# Patient Record
Sex: Female | Born: 1980 | Race: Black or African American | Hispanic: No | Marital: Single | Smoking: Never smoker
Health system: Southern US, Community
[De-identification: ages and names within clinical notes are randomized; demographics above are authoritative.]

## PROBLEM LIST (undated history)

## (undated) DIAGNOSIS — F32A Depression, unspecified: Secondary | ICD-10-CM

## (undated) DIAGNOSIS — F419 Anxiety disorder, unspecified: Secondary | ICD-10-CM

## (undated) DIAGNOSIS — D179 Benign lipomatous neoplasm, unspecified: Secondary | ICD-10-CM

## (undated) DIAGNOSIS — G43909 Migraine, unspecified, not intractable, without status migrainosus: Secondary | ICD-10-CM

## (undated) DIAGNOSIS — L989 Disorder of the skin and subcutaneous tissue, unspecified: Secondary | ICD-10-CM

## (undated) DIAGNOSIS — I10 Essential (primary) hypertension: Secondary | ICD-10-CM

## (undated) DIAGNOSIS — F329 Major depressive disorder, single episode, unspecified: Secondary | ICD-10-CM

## (undated) DIAGNOSIS — D649 Anemia, unspecified: Secondary | ICD-10-CM

## (undated) HISTORY — DX: Anxiety disorder, unspecified: F41.9

## (undated) HISTORY — DX: Migraine, unspecified, not intractable, without status migrainosus: G43.909

## (undated) HISTORY — PX: NO PAST SURGERIES: SHX2092

## (undated) HISTORY — DX: Depression, unspecified: F32.A

---
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
## 1999-03-14 ENCOUNTER — Emergency Department (HOSPITAL_COMMUNITY): Admission: EM | Admit: 1999-03-14 | Discharge: 1999-03-15 | Payer: Self-pay | Admitting: Emergency Medicine

## 2001-03-08 ENCOUNTER — Emergency Department (HOSPITAL_COMMUNITY): Admission: EM | Admit: 2001-03-08 | Discharge: 2001-03-09 | Payer: Self-pay | Admitting: Emergency Medicine

## 2001-04-12 ENCOUNTER — Emergency Department (HOSPITAL_COMMUNITY): Admission: EM | Admit: 2001-04-12 | Discharge: 2001-04-12 | Payer: Self-pay | Admitting: Emergency Medicine

## 2002-06-17 ENCOUNTER — Emergency Department (HOSPITAL_COMMUNITY): Admission: EM | Admit: 2002-06-17 | Discharge: 2002-06-17 | Payer: Self-pay | Admitting: Emergency Medicine

## 2002-06-26 ENCOUNTER — Emergency Department (HOSPITAL_COMMUNITY): Admission: AD | Admit: 2002-06-26 | Discharge: 2002-06-26 | Payer: Self-pay | Admitting: Emergency Medicine

## 2004-06-21 ENCOUNTER — Emergency Department (HOSPITAL_COMMUNITY): Admission: EM | Admit: 2004-06-21 | Discharge: 2004-06-21 | Payer: Self-pay | Admitting: Emergency Medicine

## 2004-08-28 ENCOUNTER — Emergency Department (HOSPITAL_COMMUNITY): Admission: EM | Admit: 2004-08-28 | Discharge: 2004-08-29 | Payer: Self-pay | Admitting: Emergency Medicine

## 2004-10-06 ENCOUNTER — Emergency Department (HOSPITAL_COMMUNITY): Admission: EM | Admit: 2004-10-06 | Discharge: 2004-10-06 | Payer: Self-pay | Admitting: Emergency Medicine

## 2004-10-16 ENCOUNTER — Emergency Department (HOSPITAL_COMMUNITY): Admission: EM | Admit: 2004-10-16 | Discharge: 2004-10-16 | Payer: Self-pay | Admitting: Emergency Medicine

## 2004-11-13 ENCOUNTER — Ambulatory Visit (HOSPITAL_COMMUNITY): Admission: RE | Admit: 2004-11-13 | Discharge: 2004-11-13 | Payer: Self-pay | Admitting: *Deleted

## 2005-02-17 ENCOUNTER — Inpatient Hospital Stay (HOSPITAL_COMMUNITY): Admission: AD | Admit: 2005-02-17 | Discharge: 2005-02-17 | Payer: Self-pay | Admitting: Obstetrics

## 2005-04-15 ENCOUNTER — Inpatient Hospital Stay (HOSPITAL_COMMUNITY): Admission: AD | Admit: 2005-04-15 | Discharge: 2005-04-18 | Payer: Self-pay | Admitting: Obstetrics

## 2006-11-05 IMAGING — US US OB COMP LESS 14 WK
1 series · 14 of 28 positions shown · non-contrast
Comparison: none

CLINICAL DATA: Abdominal pain. 
 TRANSABDOMINAL AND TRANSVAGINAL PELVIC ULTRASOUND - 08/29/04:
TECHNIQUE: Both transabdominal and transvaginal ultrasound examinations of the pelvis were performed including evaluation of the uterus, ovaries, adnexal regions, and pelvic cul-de-sac.
 There is a gestational sac, as well as an embryo, with a crown rump length of 12.4 mm, consistent with a gestational age of 7 weeks, 3 days.  There is an 11-mm cyst on the otherwise normal right ovary.  The left ovary is normal.  No free fluid.

[Series 1: unknown · 0.32mm/px · 14 of 42 slices shown]
[im 2/42]
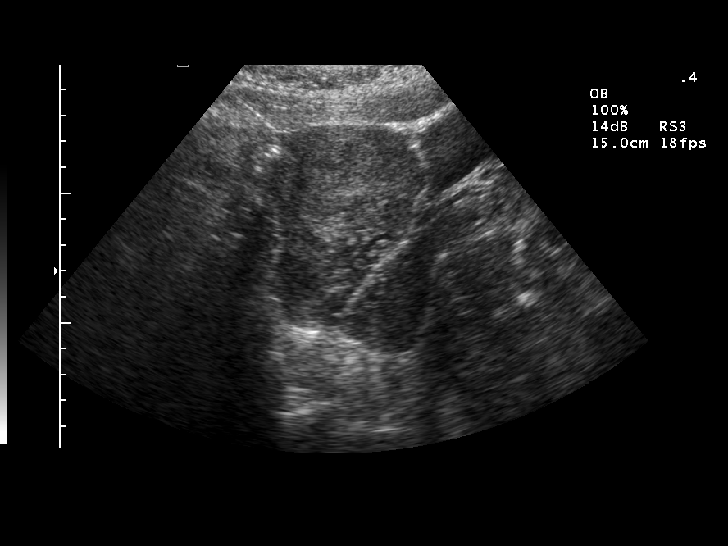
[im 5/42]
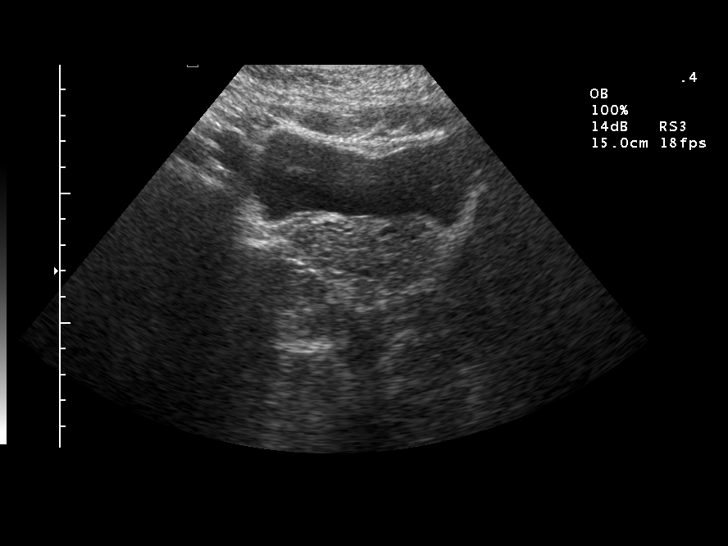
[im 8/42]
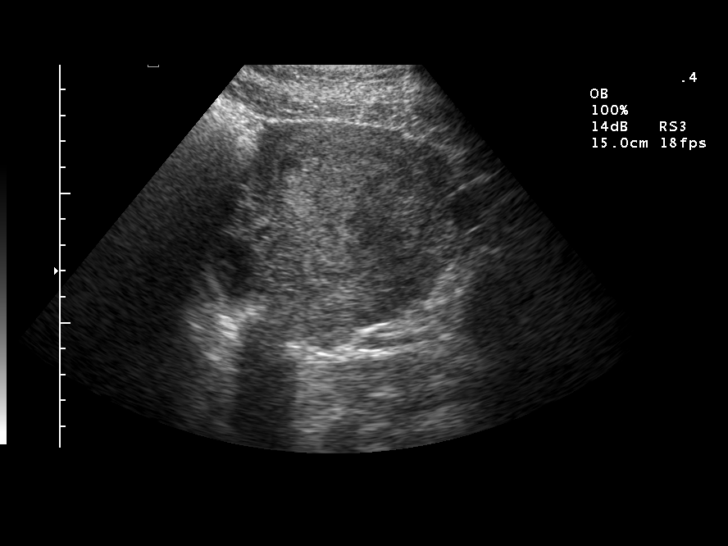
[im 11/42]
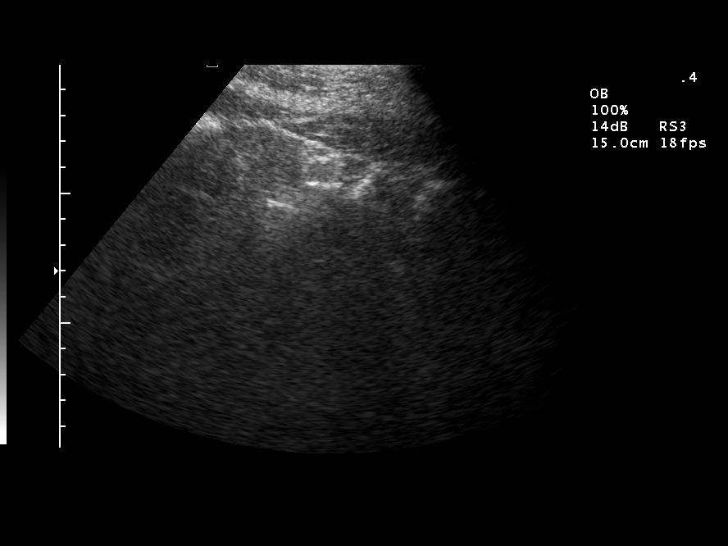
[im 14/42]
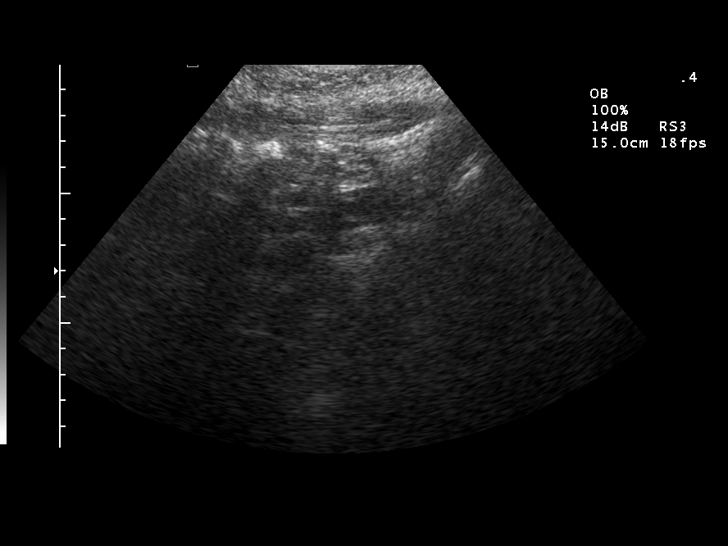
[im 17/42]
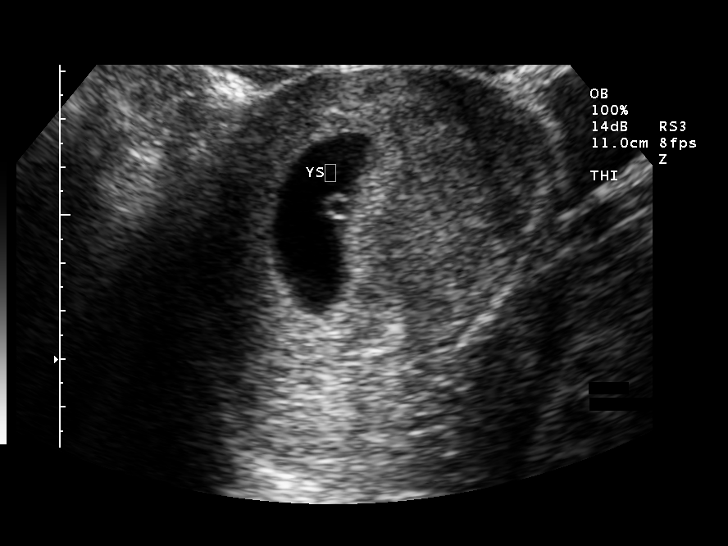
[im 20/42]
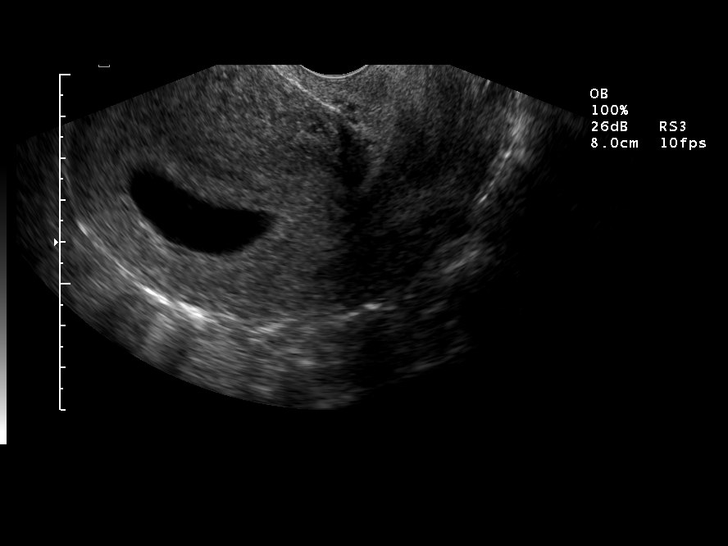
[im 23/42]
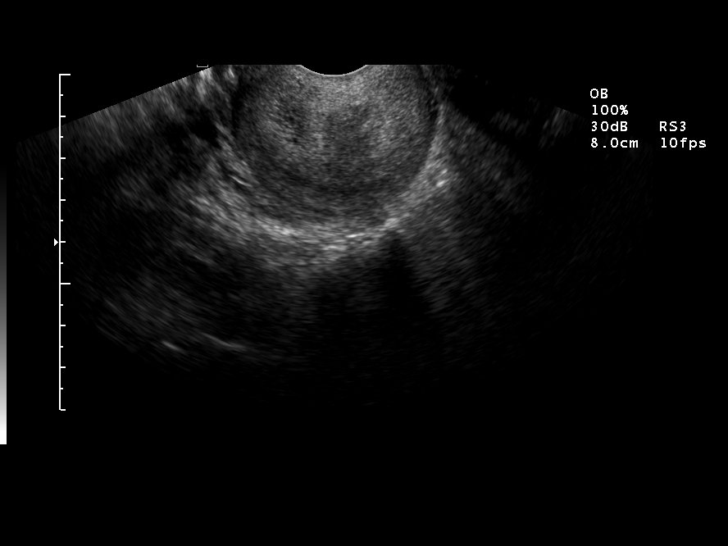
[im 26/42]
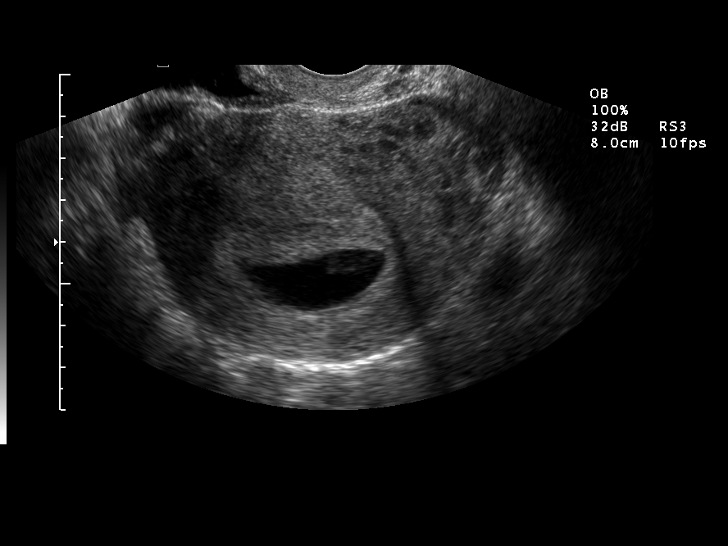
[im 29/42]
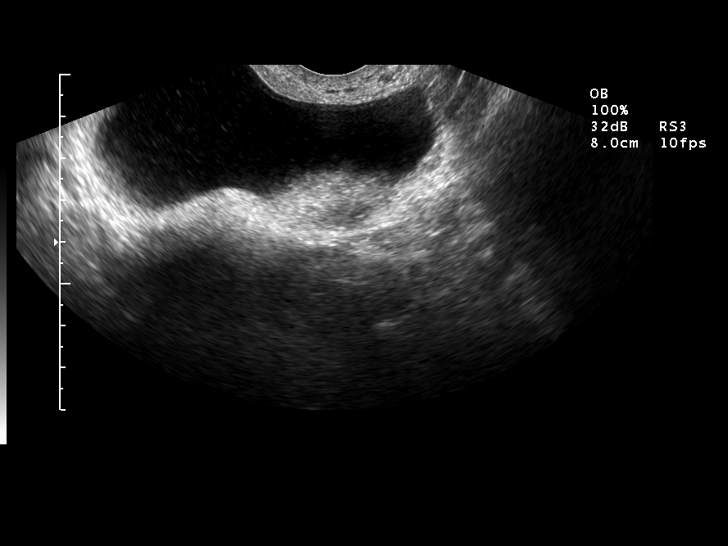
[im 32/42]
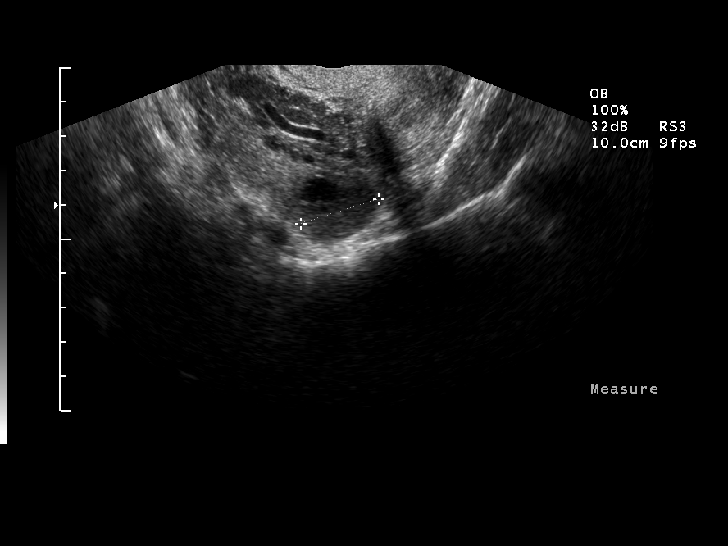
[im 35/42]
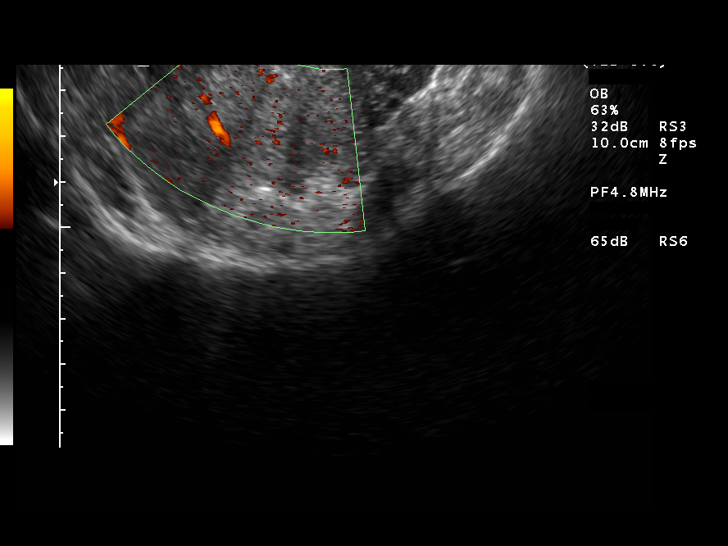
[im 38/42]
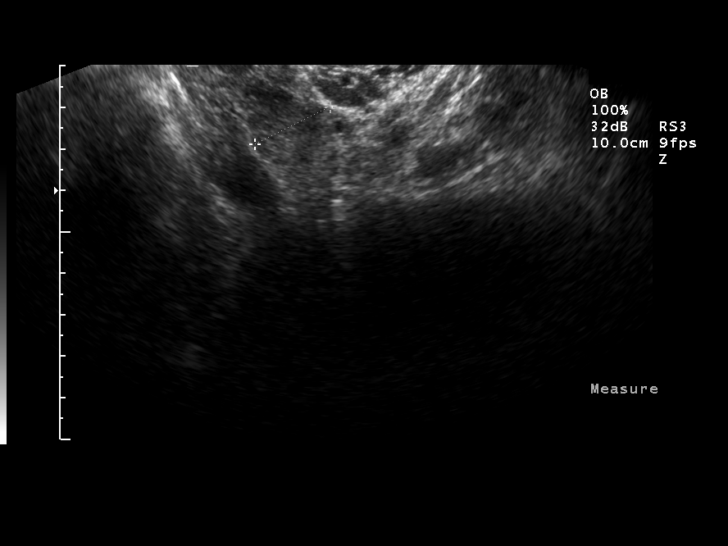
[im 42/42]
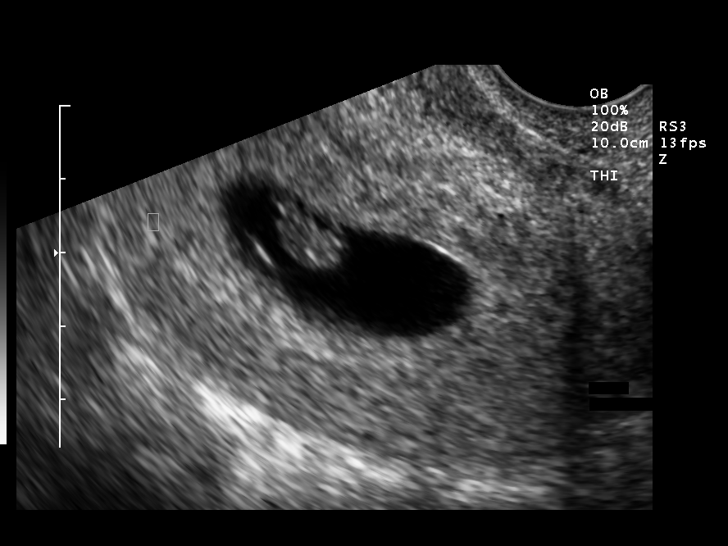

[14 of 28 positions shown; findings below may reference images not displayed]

IMPRESSION: Normal-appearing single intrauterine pregnancy of approximately 7 weeks, 3 days' gestation.

## 2007-01-20 IMAGING — US US OB COMP +14 WK
1 series · 13 of 28 positions shown · non-contrast
Comparison: none

CLINICAL DATA: 18 week 4 day assigned gestational age by early ultrasound.  Evaluate fetal growth and anatomy.

[Series 1: us ob comp +14 wk · 0.33mm/px · 13 of 54 slices shown]
[im 2/54]
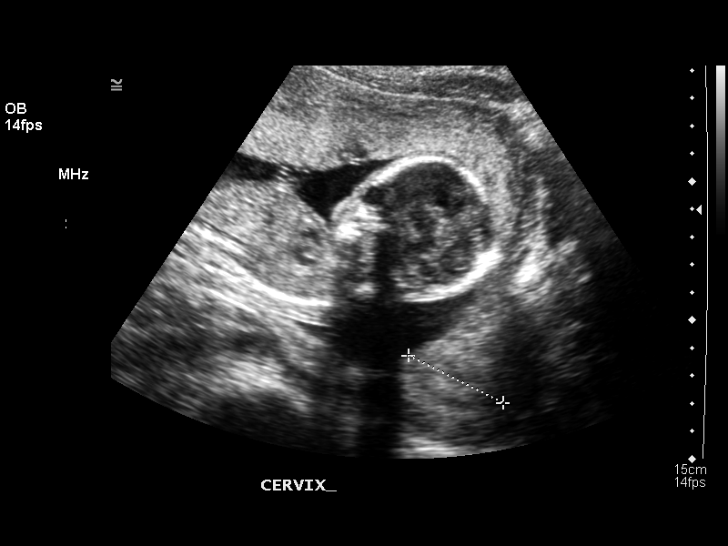
[im 6/54]
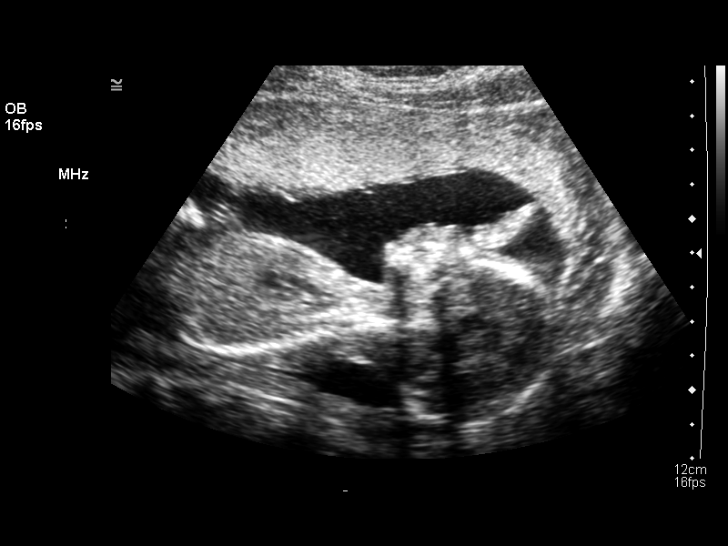
[im 10/54]
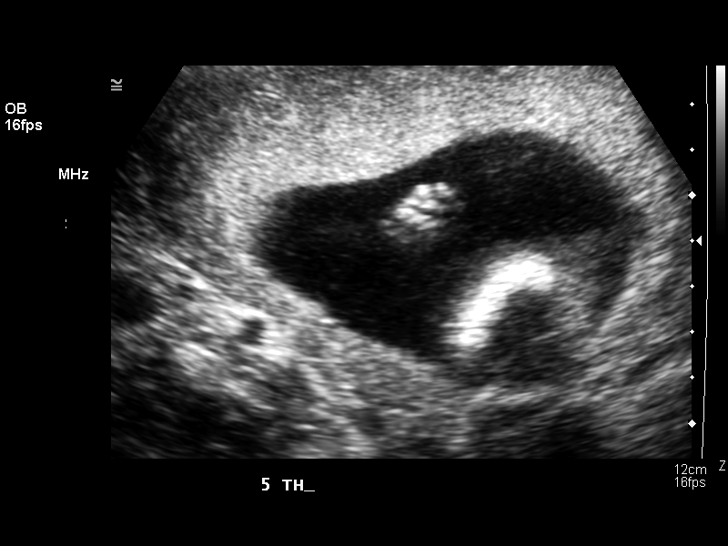
[im 14/54]
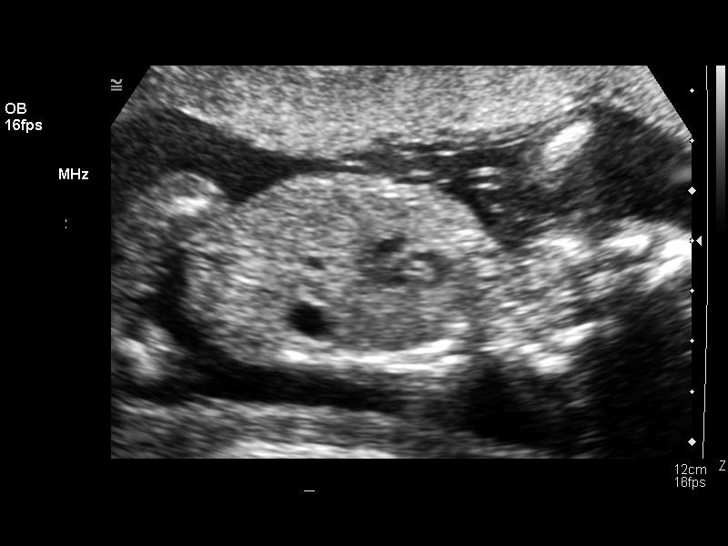
[im 18/54]
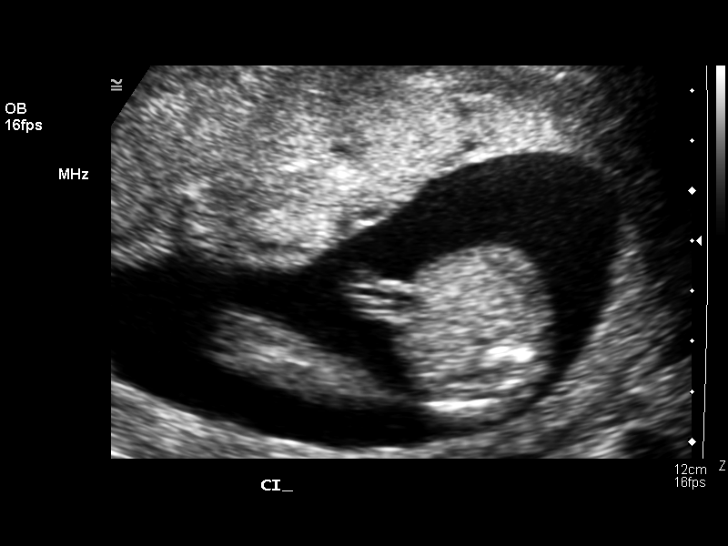
[im 22/54]
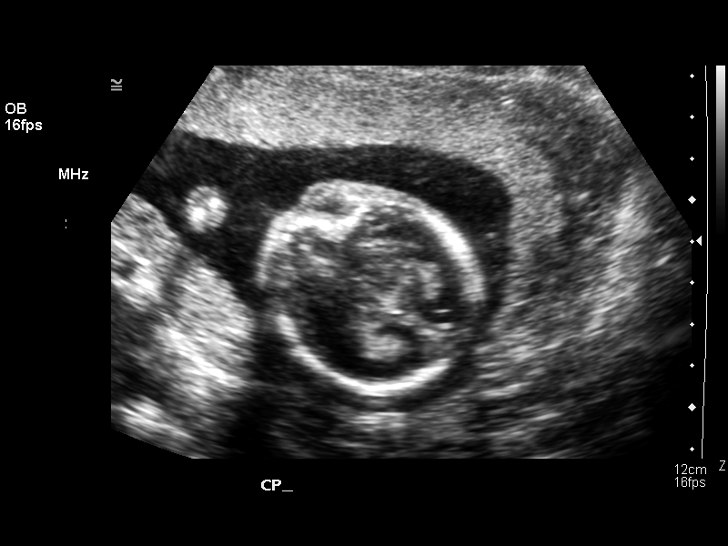
[im 28/54]
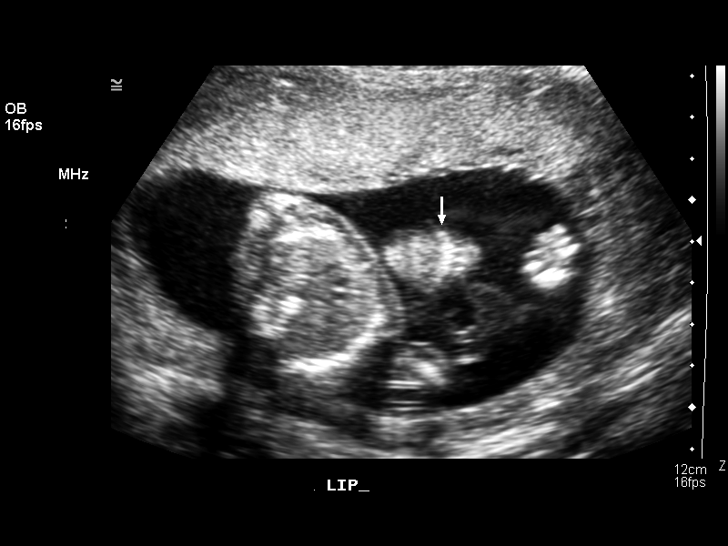
[im 32/54]
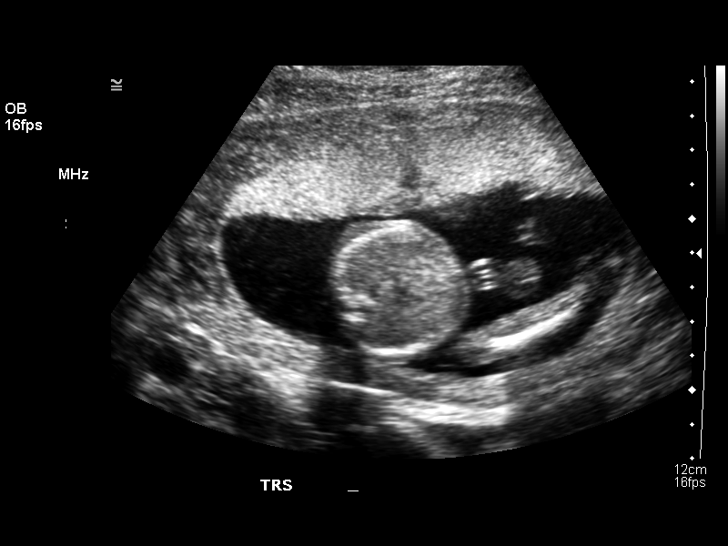
[im 36/54]
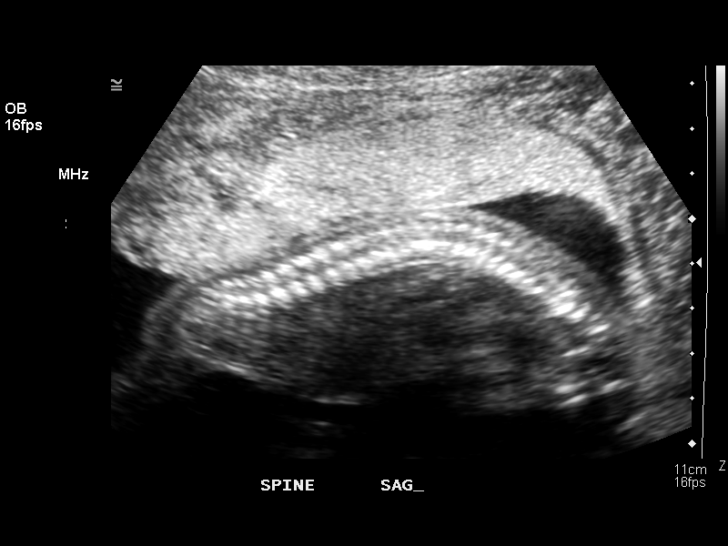
[im 40/54]
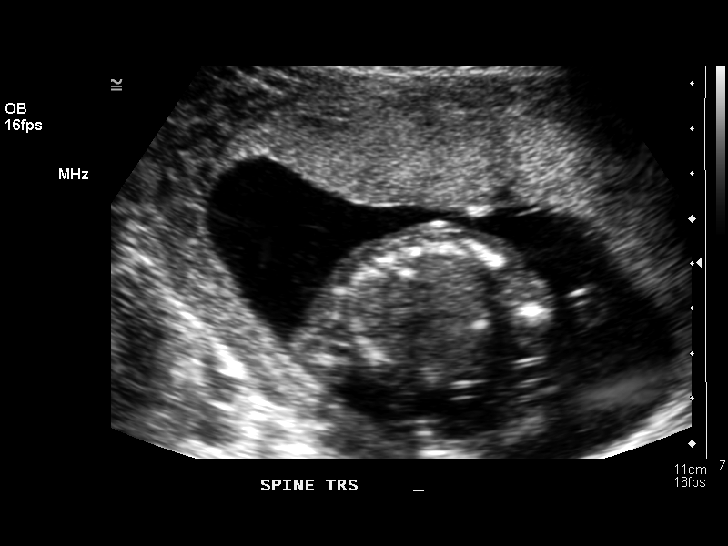
[im 44/54]
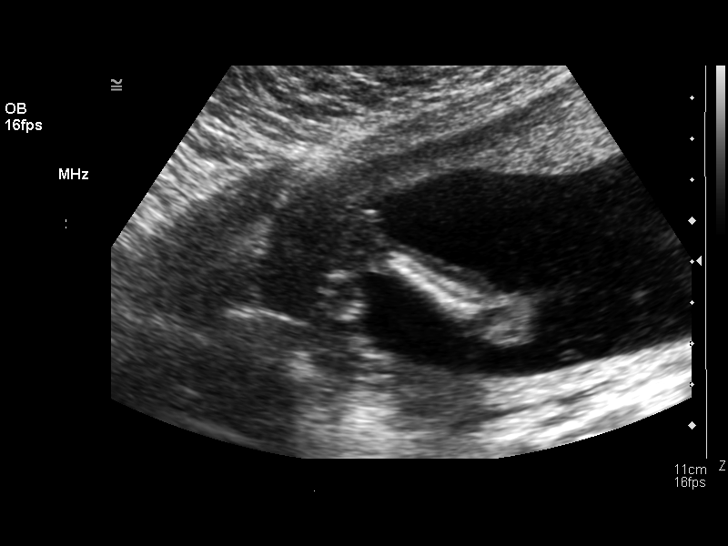
[im 48/54]
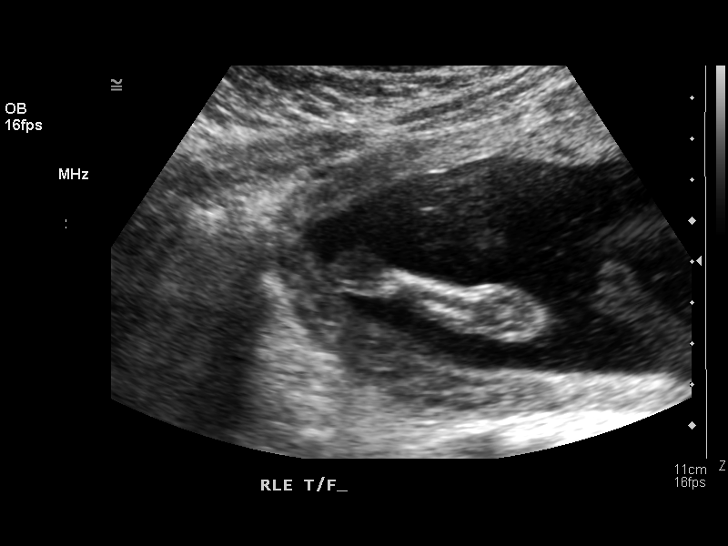
[im 52/54]
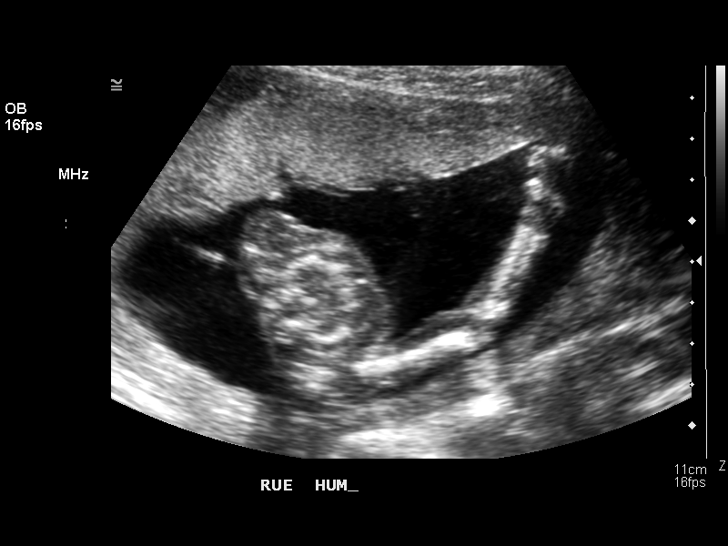

[13 of 28 positions shown; findings below may reference images not displayed]

OBSTETRICAL ULTRASOUND:
Number of Fetuses:  1
Heart Rate:  Seen, not recorded
Movement:  Yes
Breathing:  No  
Presentation:  Cephalic
Placental Location:  Anterior
Grade:  I
Previa:  No
Amniotic Fluid (Subjective):  Normal
Amniotic Fluid (Objective):   3.5 cm Vertical pocket 

FETAL BIOMETRY
BPD:  4.4 cm   19 w 2 d
HC:  16.2 cm   19 w 0 d
AC:  12.8 cm   18 w 3 d
FL:    3.0 cm   19 w 1 d

MEAN GA:  19 w 0 d       
FETAL ANATOMY
Lateral Ventricles:    Visualized 
Thalami/CSP:      Visualized 
Posterior Fossa:  Visualized 
Nuchal Region:    Visualized 
Spine:      Visualized 
4 Chamber Heart on Left:      Visualized 
Stomach on Left:      Visualized 
3 Vessel Cord:    Visualized 
Cord Insertion site:    Visualized 
Kidneys:  Visualized 
Bladder:  Visualized 
Extremities:      Visualized 

ADDITIONAL ANATOMY VISUALIZED:  LVOT, RVOT, upper lip, orbits, profile, diaphragm, heel, 5th digit, ductal arch, aortic arch, and male genitalia.

MATERNAL UTERINE AND ADNEXAL FINDINGS
Cervix:   3.8 cm Transabdominally
IMPRESSION: 1.  Assigned gestational age by early ultrasound is currently 18 weeks 4 days.  Appropriate fetal growth.
2.  No evidence of fetal anatomic abnormality.

## 2007-10-21 ENCOUNTER — Emergency Department (HOSPITAL_COMMUNITY): Admission: EM | Admit: 2007-10-21 | Discharge: 2007-10-21 | Payer: Self-pay | Admitting: Emergency Medicine

## 2009-07-24 ENCOUNTER — Emergency Department (HOSPITAL_COMMUNITY): Admission: EM | Admit: 2009-07-24 | Discharge: 2009-07-24 | Payer: Self-pay | Admitting: Emergency Medicine

## 2010-11-18 DIAGNOSIS — L910 Hypertrophic scar: Secondary | ICD-10-CM | POA: Insufficient documentation

## 2012-11-19 ENCOUNTER — Other Ambulatory Visit: Payer: Self-pay | Admitting: Family Medicine

## 2012-11-19 ENCOUNTER — Other Ambulatory Visit (HOSPITAL_COMMUNITY)
Admission: RE | Admit: 2012-11-19 | Discharge: 2012-11-19 | Disposition: A | Discharge status: Home / Self Care | Payer: 59 | Source: Ambulatory Visit | Attending: Family Medicine | Admitting: Family Medicine

## 2012-11-19 DIAGNOSIS — Z113 Encounter for screening for infections with a predominantly sexual mode of transmission: Secondary | ICD-10-CM | POA: Insufficient documentation

## 2012-11-19 DIAGNOSIS — Z124 Encounter for screening for malignant neoplasm of cervix: Secondary | ICD-10-CM | POA: Insufficient documentation

## 2013-09-27 ENCOUNTER — Other Ambulatory Visit: Payer: Self-pay | Admitting: Family Medicine

## 2013-09-27 DIAGNOSIS — R51 Headache: Secondary | ICD-10-CM

## 2013-09-30 ENCOUNTER — Other Ambulatory Visit: Payer: 59

## 2013-11-29 ENCOUNTER — Encounter (HOSPITAL_COMMUNITY): Payer: Self-pay | Admitting: General Practice

## 2013-11-29 ENCOUNTER — Inpatient Hospital Stay (HOSPITAL_COMMUNITY)
Admission: AD | Admit: 2013-11-29 | Discharge: 2013-11-29 | Disposition: A | Discharge status: Home / Self Care | Payer: 59 | Source: Ambulatory Visit | Attending: Obstetrics & Gynecology | Admitting: Obstetrics & Gynecology

## 2013-11-29 DIAGNOSIS — N920 Excessive and frequent menstruation with regular cycle: Secondary | ICD-10-CM | POA: Insufficient documentation

## 2013-11-29 DIAGNOSIS — D649 Anemia, unspecified: Secondary | ICD-10-CM | POA: Insufficient documentation

## 2013-11-29 DIAGNOSIS — O036 Delayed or excessive hemorrhage following complete or unspecified spontaneous abortion: Secondary | ICD-10-CM | POA: Diagnosis present

## 2013-11-29 HISTORY — DX: Essential (primary) hypertension: I10

## 2013-11-29 LAB — URINALYSIS, ROUTINE W REFLEX MICROSCOPIC
Bilirubin Urine: NEGATIVE
Glucose, UA: NEGATIVE mg/dL
Ketones, ur: NEGATIVE mg/dL
Leukocytes, UA: NEGATIVE
Nitrite: NEGATIVE
Protein, ur: NEGATIVE mg/dL
Specific Gravity, Urine: 1.02 (ref 1.005–1.030)
Urobilinogen, UA: 0.2 mg/dL (ref 0.0–1.0)
pH: 7.5 (ref 5.0–8.0)

## 2013-11-29 LAB — URINE MICROSCOPIC-ADD ON

## 2013-11-29 LAB — CBC
HCT: 29.6 % — ABNORMAL LOW (ref 36.0–46.0)
Hemoglobin: 9.7 g/dL — ABNORMAL LOW (ref 12.0–15.0)
MCH: 26.3 pg (ref 26.0–34.0)
MCHC: 32.8 g/dL (ref 30.0–36.0)
MCV: 80.2 fL (ref 78.0–100.0)
Platelets: 228 10*3/uL (ref 150–400)
RBC: 3.69 MIL/uL — ABNORMAL LOW (ref 3.87–5.11)
RDW: 13.1 % (ref 11.5–15.5)
WBC: 6.8 10*3/uL (ref 4.0–10.5)

## 2013-11-29 LAB — POCT PREGNANCY, URINE: Preg Test, Ur: NEGATIVE

## 2013-11-29 MED ORDER — INTEGRA F 125-1 MG PO CAPS: 1.0000 | ORAL_CAPSULE | Freq: Every day | ORAL | Status: DC

## 2013-11-29 MED ORDER — MEDROXYPROGESTERONE ACETATE 10 MG PO TABS: 10.0000 mg | ORAL_TABLET | Freq: Every day | ORAL | Status: DC

## 2013-11-29 NOTE — Discharge Instructions (Signed)
Abnormal Uterine Bleeding Abnormal uterine bleeding can affect women at various stages in life, including teenagers, women in their reproductive years, pregnant women, and women who have reached menopause. Several kinds of uterine bleeding are considered abnormal, including:  Bleeding or spotting between periods.   Bleeding after sexual intercourse.   Bleeding that is heavier or more than normal.   Periods that last longer than usual.  Bleeding after menopause.  Many cases of abnormal uterine bleeding are minor and simple to treat, while others are more serious. Any type of abnormal bleeding should be evaluated by your health care provider. Treatment will depend on the cause of the bleeding. HOME CARE INSTRUCTIONS Monitor your condition for any changes. The following actions may help to alleviate any discomfort you are experiencing:  Avoid the use of tampons and douches as directed by your health care provider.  Change your pads frequently. You should get regular pelvic exams and Pap tests. Keep all follow-up appointments for diagnostic tests as directed by your health care provider.  SEEK MEDICAL CARE IF:   Your bleeding lasts more than 1 week.   You feel dizzy at times.  SEEK IMMEDIATE MEDICAL CARE IF:   You pass out.   You are changing pads every 15 to 30 minutes.   You have abdominal pain.  You have a fever.   You become sweaty or weak.   You are passing large blood clots from the vagina.   You start to feel nauseous and vomit. MAKE SURE YOU:   Understand these instructions.  Will watch your condition.  Will get help right away if you are not doing well or get worse. Document Released: 01/13/2005 Document Revised: 01/18/2013 Document Reviewed: 08/12/2012 [employer] Patient Information 2015 [employer]. This information is not intended to replace advice given to you by your health care provider. Make sure you discuss any questions you have with your  health care provider.  

## 2013-11-29 NOTE — MAU Provider Note (Signed)
History     CSN: [id number]  Arrival date and time: 11/29/13 1306   First Provider Initiated Contact with Patient 11/29/13 1400      Chief Complaint  Patient presents with  . Vaginal Bleeding  . Abdominal Pain   HPI  Pt is a [age] here status post a medical abortion on 9-12,  Pt states she went for her follow-up appt mid-october and was told no more pregnancy tissue was in the uterus via ultrasound. Patient states has continued to have pain every day and bleeding every day that will go from light bleeding to heavy bleeding with clots. Reports intermittent feeling of lightheadedness and dizziness.  Also reports feeling dehydrated. +appt with Maryanna Shape this week to establish primary care.     Past Medical History  Diagnosis Date  . Hypertension     Past Surgical History  Procedure Laterality Date  . No past surgeries      History reviewed. No pertinent family history.  History  Substance Use Topics  . Smoking status: Never Smoker   . Smokeless tobacco: Never Used  . Alcohol Use: Yes     Comment: occasionally    Allergies: No Known Allergies  No prescriptions prior to admission    Review of Systems  Constitutional: Negative for fever and chills.  Gastrointestinal: Positive for nausea and abdominal pain (pelvic pain). Negative for vomiting.  Genitourinary:       Vaginal bleeding  Neurological: Positive for dizziness.   Physical Exam   Blood pressure 127/97, pulse 106, temperature 99.3 F (37.4 C), temperature source Oral, resp. rate 16, height 5\' 6"  (1.676 m), weight 84.823 kg (187 lb), last menstrual period 08/07/2013, SpO2 97 %.  Physical Exam  Constitutional: She is oriented to person, place, and time. She appears well-developed and well-nourished. No distress.  HENT:  Head: Normocephalic.  Neck: Normal range of motion. Neck supple.  Cardiovascular: Normal rate, regular rhythm and normal heart sounds.   Respiratory: Effort normal and breath sounds normal.   GI: Soft. She exhibits no mass. There is tenderness. There is no guarding.  Genitourinary: There is bleeding (negative clots; scant) in the vagina.  Neurological: She is alert and oriented to person, place, and time. She has normal reflexes.  Skin: Skin is warm and dry.    MAU Course  Procedures Results for orders placed or performed during the hospital encounter of 11/29/13 (from the past 24 hour(s))  Urinalysis, Routine w reflex microscopic     Status: Abnormal   Collection Time: 11/29/13  1:53 PM  Result Value Ref Range   Color, Urine YELLOW YELLOW   APPearance HAZY (A) CLEAR   Specific Gravity, Urine 1.020 1.005 - 1.030   pH 7.5 5.0 - 8.0   Glucose, UA NEGATIVE NEGATIVE mg/dL   Hgb urine dipstick LARGE (A) NEGATIVE   Bilirubin Urine NEGATIVE NEGATIVE   Ketones, ur NEGATIVE NEGATIVE mg/dL   Protein, ur NEGATIVE NEGATIVE mg/dL   Urobilinogen, UA 0.2 0.0 - 1.0 mg/dL   Nitrite NEGATIVE NEGATIVE   Leukocytes, UA NEGATIVE NEGATIVE  Urine microscopic-add on     Status: Abnormal   Collection Time: 11/29/13  1:53 PM  Result Value Ref Range   Squamous Epithelial / LPF FEW (A) RARE   WBC, UA 0-2 <3 WBC/hpf   RBC / HPF 21-50 <3 RBC/hpf   Bacteria, UA RARE RARE  Pregnancy, urine POC     Status: None   Collection Time: 11/29/13  1:55 PM  Result Value Ref Range  Preg Test, Ur NEGATIVE NEGATIVE  CBC     Status: Abnormal   Collection Time: 11/29/13  2:24 PM  Result Value Ref Range   WBC 6.8 4.0 - 10.5 K/uL   RBC 3.69 (L) 3.87 - 5.11 MIL/uL   Hemoglobin 9.7 (L) 12.0 - 15.0 g/dL   HCT 29.6 (L) 36.0 - 46.0 %   MCV 80.2 78.0 - 100.0 fL   MCH 26.3 26.0 - 34.0 pg   MCHC 32.8 30.0 - 36.0 g/dL   RDW 13.1 11.5 - 15.5 %   Platelets 228 150 - 400 K/uL    Assessment and Plan  Menorrhagia Anemia  Plan: Discharge to home RX Integra i po q day RX Provera 10 mg qd Bleeding precautions given Keep scheduled appointment with Lynden Ang, [facility] 11/29/2013, 2:02 PM

## 2013-11-29 NOTE — MAU Note (Signed)
Patient states she had a chemically terminated pregnancy on 9-12, States she went for her follow up and was told no more pregnancy tissue was in the uterus. Patient states has continued to have pain every day and bleeding every day that will go from light bleeding to heavy bleeding with clots. Has periods of feeling lightheaded and dizzy, feels dehydrated.

## 2013-12-02 ENCOUNTER — Ambulatory Visit (INDEPENDENT_AMBULATORY_CARE_PROVIDER_SITE_OTHER): Payer: 59 | Admitting: Family

## 2013-12-02 ENCOUNTER — Ambulatory Visit (INDEPENDENT_AMBULATORY_CARE_PROVIDER_SITE_OTHER): Payer: 59

## 2013-12-02 ENCOUNTER — Encounter: Payer: Self-pay | Admitting: Family

## 2013-12-02 VITALS — BP 130/90 | HR 102 | Ht 65.5 in | Wt 185.0 lb

## 2013-12-02 DIAGNOSIS — D62 Acute posthemorrhagic anemia: Secondary | ICD-10-CM

## 2013-12-02 DIAGNOSIS — I10 Essential (primary) hypertension: Secondary | ICD-10-CM | POA: Insufficient documentation

## 2013-12-02 DIAGNOSIS — G43909 Migraine, unspecified, not intractable, without status migrainosus: Secondary | ICD-10-CM | POA: Insufficient documentation

## 2013-12-02 DIAGNOSIS — G43109 Migraine with aura, not intractable, without status migrainosus: Secondary | ICD-10-CM

## 2013-12-02 DIAGNOSIS — G43009 Migraine without aura, not intractable, without status migrainosus: Secondary | ICD-10-CM | POA: Insufficient documentation

## 2013-12-02 DIAGNOSIS — E785 Hyperlipidemia, unspecified: Secondary | ICD-10-CM

## 2013-12-02 DIAGNOSIS — N939 Abnormal uterine and vaginal bleeding, unspecified: Secondary | ICD-10-CM

## 2013-12-02 DIAGNOSIS — Z23 Encounter for immunization: Secondary | ICD-10-CM

## 2013-12-02 LAB — POCT URINE PREGNANCY: Preg Test, Ur: NEGATIVE

## 2013-12-02 LAB — COMPREHENSIVE METABOLIC PANEL
ALT: 11 U/L (ref 0–35)
AST: 18 U/L (ref 0–37)
Albumin: 3.8 g/dL (ref 3.5–5.2)
Alkaline Phosphatase: 56 U/L (ref 39–117)
BUN: 10 mg/dL (ref 6–23)
CO2: 23 mEq/L (ref 19–32)
Calcium: 9 mg/dL (ref 8.4–10.5)
Chloride: 103 mEq/L (ref 96–112)
Creatinine, Ser: 0.8 mg/dL (ref 0.4–1.2)
GFR: 103.95 mL/min (ref >60.00)
Glucose, Bld: 83 mg/dL (ref 70–99)
Potassium: 3.3 mEq/L — ABNORMAL LOW (ref 3.5–5.1)
Sodium: 137 mEq/L (ref 135–145)
Total Bilirubin: 0.6 mg/dL (ref 0.2–1.2)
Total Protein: 7.5 g/dL (ref 6.0–8.3)

## 2013-12-02 LAB — CBC WITH DIFFERENTIAL/PLATELET
Basophils Absolute: 0 10*3/uL (ref 0.0–0.1)
Basophils Relative: 0.4 % (ref 0.0–3.0)
Eosinophils Absolute: 0.2 10*3/uL (ref 0.0–0.7)
Eosinophils Relative: 2.5 % (ref 0.0–5.0)
HCT: 29 % — ABNORMAL LOW (ref 36.0–46.0)
Hemoglobin: 9.4 g/dL — ABNORMAL LOW (ref 12.0–15.0)
Lymphocytes Relative: 28 % (ref 12.0–46.0)
Lymphs Abs: 2.2 10*3/uL (ref 0.7–4.0)
MCHC: 32.3 g/dL (ref 30.0–36.0)
MCV: 80.2 fl (ref 78.0–100.0)
Monocytes Absolute: 0.6 10*3/uL (ref 0.1–1.0)
Monocytes Relative: 7.7 % (ref 3.0–12.0)
Neutro Abs: 4.8 10*3/uL (ref 1.4–7.7)
Neutrophils Relative %: 61.4 % (ref 43.0–77.0)
Platelets: 268 10*3/uL (ref 150.0–400.0)
RBC: 3.61 Mil/uL — ABNORMAL LOW (ref 3.87–5.11)
RDW: 13.9 % (ref 11.5–15.5)
WBC: 7.8 10*3/uL (ref 4.0–10.5)

## 2013-12-02 LAB — LIPID PANEL
Cholesterol: 189 mg/dL (ref 0–200)
HDL: 46.9 mg/dL (ref >39.00)
LDL Cholesterol: 131 mg/dL — ABNORMAL HIGH (ref 0–99)
NonHDL: 142.1
Total CHOL/HDL Ratio: 4
Triglycerides: 57 mg/dL (ref 0.0–149.0)
VLDL: 11.4 mg/dL (ref 0.0–40.0)

## 2013-12-02 LAB — TSH: TSH: 1.34 u[IU]/mL (ref 0.35–4.50)

## 2013-12-02 MED ORDER — SUMATRIPTAN SUCCINATE 50 MG PO TABS: 50.0000 mg | ORAL_TABLET | ORAL | Status: DC | PRN

## 2013-12-02 NOTE — Patient Instructions (Addendum)
Levonorgestrel intrauterine device (IUD) What is this medicine? LEVONORGESTREL IUD (LEE voe nor jes trel) is a contraceptive (birth control) device. The device is placed inside the uterus by a healthcare professional. It is used to prevent pregnancy and can also be used to treat heavy bleeding that occurs during your period. Depending on the device, it can be used for 3 to 5 years. This medicine may be used for other purposes; ask your health care provider or pharmacist if you have questions. COMMON BRAND NAME(S): LILETTA, Mirena, Skyla What should I tell my health care provider before I take this medicine? They need to know if you have any of these conditions: -abnormal Pap smear -cancer of the breast, uterus, or cervix -diabetes -endometritis -genital or pelvic infection now or in the past -have more than one sexual partner or your partner has more than one partner -heart disease -history of an ectopic or tubal pregnancy -immune system problems -IUD in place -liver disease or tumor -problems with blood clots or take blood-thinners -use intravenous drugs -uterus of unusual shape -vaginal bleeding that has not been explained -an unusual or allergic reaction to levonorgestrel, other hormones, silicone, or polyethylene, medicines, foods, dyes, or preservatives -pregnant or trying to get pregnant -breast-feeding How should I use this medicine? This device is placed inside the uterus by a health care professional. Talk to your pediatrician regarding the use of this medicine in children. Special care may be needed. Overdosage: If you think you have taken too much of this medicine contact a poison control center or emergency room at once. NOTE: This medicine is only for you. Do not share this medicine with others. What if I miss a dose? This does not apply. What may interact with this medicine? Do not take this medicine with any of the following  medications: -amprenavir -bosentan -fosamprenavir This medicine may also interact with the following medications: -aprepitant -barbiturate medicines for inducing sleep or treating seizures -bexarotene -griseofulvin -medicines to treat seizures like carbamazepine, ethotoin, felbamate, oxcarbazepine, phenytoin, topiramate -modafinil -pioglitazone -rifabutin -rifampin -rifapentine -some medicines to treat HIV infection like atazanavir, indinavir, lopinavir, nelfinavir, tipranavir, ritonavir -St. John's wort -warfarin This list may not describe all possible interactions. Give your health care provider a list of all the medicines, herbs, non-prescription drugs, or dietary supplements you use. Also tell them if you smoke, drink alcohol, or use illegal drugs. Some items may interact with your medicine. What should I watch for while using this medicine? Visit your doctor or health care professional for regular check ups. See your doctor if you or your partner has sexual contact with others, becomes HIV positive, or gets a sexual transmitted disease. This product does not protect you against HIV infection (AIDS) or other sexually transmitted diseases. You can check the placement of the IUD yourself by reaching up to the top of your vagina with clean fingers to feel the threads. Do not pull on the threads. It is a good habit to check placement after each menstrual period. Call your doctor right away if you feel more of the IUD than just the threads or if you cannot feel the threads at all. The IUD may come out by itself. You may become pregnant if the device comes out. If you notice that the IUD has come out use a backup birth control method like condoms and call your health care provider. Using tampons will not change the position of the IUD and are okay to use during your period. What side effects may   I notice from receiving this medicine? Side effects that you should report to your doctor or  health care professional as soon as possible: -allergic reactions like skin rash, itching or hives, swelling of the face, lips, or tongue -fever, flu-like symptoms -genital sores -high blood pressure -no menstrual period for 6 weeks during use -pain, swelling, warmth in the leg -pelvic pain or tenderness -severe or sudden headache -signs of pregnancy -stomach cramping -sudden shortness of breath -trouble with balance, talking, or walking -unusual vaginal bleeding, discharge -yellowing of the eyes or skin Side effects that usually do not require medical attention (report to your doctor or health care professional if they continue or are bothersome): -acne -breast pain -change in sex drive or performance -changes in weight -cramping, dizziness, or faintness while the device is being inserted -headache -irregular menstrual bleeding within first 3 to 6 months of use -nausea This list may not describe all possible side effects. Call your doctor for medical advice about side effects. You may report side effects to FDA at 1-800-FDA-1088. Where should I keep my medicine? This does not apply. NOTE: This sheet is a summary. It may not cover all possible information. If you have questions about this medicine, talk to your doctor, pharmacist, or health care provider.  2015, [employer] Standard. (2011-02-13 13:54:04)   Abnormal Uterine Bleeding Abnormal uterine bleeding can affect women at various stages in life, including teenagers, women in their reproductive years, pregnant women, and women who have reached menopause. Several kinds of uterine bleeding are considered abnormal, including:  Bleeding or spotting between periods.   Bleeding after sexual intercourse.   Bleeding that is heavier or more than normal.   Periods that last longer than usual.  Bleeding after menopause.  Many cases of abnormal uterine bleeding are minor and simple to treat, while others are more serious.  Any type of abnormal bleeding should be evaluated by your health care provider. Treatment will depend on the cause of the bleeding. HOME CARE INSTRUCTIONS Monitor your condition for any changes. The following actions may help to alleviate any discomfort you are experiencing:  Avoid the use of tampons and douches as directed by your health care provider.  Change your pads frequently. You should get regular pelvic exams and Pap tests. Keep all follow-up appointments for diagnostic tests as directed by your health care provider.  SEEK MEDICAL CARE IF:   Your bleeding lasts more than 1 week.   You feel dizzy at times.  SEEK IMMEDIATE MEDICAL CARE IF:   You pass out.   You are changing pads every 15 to 30 minutes.   You have abdominal pain.  You have a fever.   You become sweaty or weak.   You are passing large blood clots from the vagina.   You start to feel nauseous and vomit. MAKE SURE YOU:   Understand these instructions.  Will watch your condition.  Will get help right away if you are not doing well or get worse. Document Released: 01/13/2005 Document Revised: 01/18/2013 Document Reviewed: 08/12/2012 [facility] Patient Information 2015 [place], [place]. This information is not intended to replace advice given to you by your health care provider. Make sure you discuss any questions you have with your health care provider.

## 2013-12-02 NOTE — Progress Notes (Signed)
Pre visit review using our clinic review tool, if applicable. No additional management support is needed unless otherwise documented below in the visit note. 

## 2013-12-05 ENCOUNTER — Telehealth: Payer: Self-pay | Admitting: Family

## 2013-12-05 NOTE — Telephone Encounter (Signed)
emmi mailed  °

## 2013-12-05 NOTE — Progress Notes (Signed)
Subjective:    Patient ID: Daisy [name], female    DOB: November 03, 1980, 33 y.o.   MRN: [id number]  HPI  33 year old AAF, nonsmoker, G4P1A3, is in today to be established. She has concerns of vaginal bleeding after having a chemical abortion in September. She took high dose birth control pills. She continues to bleed that waxes and wanes in flow. She was seen at [facility] hospital over the weekend and they started provera. She has had 2 abortions in the last 1 year. She has a history of anemia related to heavy menstrual cycles.  Denies any concerns of STDs. Last pap smear was normal, last year.   Also reports a history of migraine headaches. Typically takes Aleve or tylenol that does not help much. She has also tried muscle relaxer's without relief. Headaches typically occur 4-5 times per month.   Review of Systems  Constitutional: Negative.   Respiratory: Negative.   Cardiovascular: Negative.   Gastrointestinal: Negative.   Endocrine: Negative.   Genitourinary: Positive for vaginal bleeding and menstrual problem.  Musculoskeletal: Negative.   Skin: Negative.   Allergic/Immunologic: Negative.   Neurological: Positive for headaches. Negative for dizziness.  Hematological: Negative.   Psychiatric/Behavioral: Negative.    Past Medical History  Diagnosis Date  . Hypertension     History   Social History  . Marital Status: Single    Spouse Name: N/A    Number of Children: N/A  . Years of Education: N/A   Occupational History  . Not on file.   Social History Main Topics  . Smoking status: Never Smoker   . Smokeless tobacco: Never Used  . Alcohol Use: Yes     Comment: occasionally  . Drug Use: No  . Sexual Activity: Not on file   Other Topics Concern  . Not on file   Social History Narrative    Past Surgical History  Procedure Laterality Date  . No past surgeries      History reviewed. No pertinent family history.  No Known Allergies  Current Outpatient  Prescriptions on File Prior to Visit  Medication Sig Dispense Refill  . acetaminophen (TYLENOL) 325 MG tablet Take 650 mg by mouth every 6 (six) hours as needed for headache.    . Fe Fum-FePoly-FA-Vit C-Vit B3 (INTEGRA F) 125-1 MG CAPS Take 1 tablet by mouth daily. 30 capsule 1  . hydrochlorothiazide (HYDRODIURIL) 25 MG tablet Take 25 mg by mouth daily as needed (when blood pressure is high).    . medroxyPROGESTERone (PROVERA) 10 MG tablet Take 1 tablet (10 mg total) by mouth daily. 10 tablet 0  . Multiple Vitamins-Minerals (MULTIVITAMIN PO) Take 1 tablet by mouth daily.    . Naproxen Sodium (ALEVE) 220 MG CAPS Take 1 capsule by mouth as needed (headaches or cramping).     No current facility-administered medications on file prior to visit.    BP 130/90 mmHg  Pulse 102  Ht 5' 5.5" (1.664 m)  Wt 185 lb (83.915 kg)  BMI 30.31 kg/m2  LMP 07/12/2015chart    Objective:   Physical Exam  Constitutional: She is oriented to person, place, and time. She appears well-developed and well-nourished.  HENT:  Right Ear: External ear normal.  Left Ear: External ear normal.  Nose: Nose normal.  Mouth/Throat: Oropharynx is clear and moist.  Neck: Normal range of motion. Neck supple. No thyromegaly present.  Cardiovascular: Normal rate, regular rhythm and normal heart sounds.   Pulmonary/Chest: Effort normal and breath sounds normal.  Abdominal:  Soft. Bowel sounds are normal.  Musculoskeletal: Normal range of motion.  Neurological: She is alert and oriented to person, place, and time. She has normal reflexes.  Skin: Skin is warm and dry.  Psychiatric: She has a normal mood and affect.          Assessment & Plan:  Daisy [name] was seen today for establish care.  Diagnoses and associated orders for this visit:  Essential hypertension, benign - CBC with Differential - CMP - TSH - POCT urine pregnancy  Abnormal vaginal bleeding - CBC with Differential - CMP - TSH - POCT urine  pregnancy - US Pelvis Complete; Future - US Transvaginal Non-OB; Future  Migraine with aura and without status migrainosus, not intractable - CBC with Differential - CMP - TSH - POCT urine pregnancy  Acute blood loss anemia - CBC with Differential - CMP - TSH - POCT urine pregnancy  Hyperlipidemia - Lipid panel  Other Orders - SUMAtriptan (IMITREX) 50 MG tablet; Take 1 tablet (50 mg total) by mouth every 2 (two) hours as needed for migraine or headache. May repeat in 2 hours if headache persists or recurs.    Needs pap smear. Will order labs and ultrasound. Follow-up pending results. Consider referral to GYN.

## 2013-12-30 ENCOUNTER — Other Ambulatory Visit: Payer: 59

## 2014-01-06 ENCOUNTER — Other Ambulatory Visit (HOSPITAL_COMMUNITY)
Admission: RE | Admit: 2014-01-06 | Discharge: 2014-01-06 | Disposition: A | Discharge status: Home / Self Care | Payer: 59 | Source: Ambulatory Visit | Attending: Family | Admitting: Family

## 2014-01-06 ENCOUNTER — Encounter: Payer: Self-pay | Admitting: Family

## 2014-01-06 ENCOUNTER — Ambulatory Visit (INDEPENDENT_AMBULATORY_CARE_PROVIDER_SITE_OTHER): Payer: 59 | Admitting: Family

## 2014-01-06 VITALS — BP 120/74 | HR 93 | Ht 65.5 in | Wt 181.7 lb

## 2014-01-06 DIAGNOSIS — N760 Acute vaginitis: Secondary | ICD-10-CM | POA: Insufficient documentation

## 2014-01-06 DIAGNOSIS — Z113 Encounter for screening for infections with a predominantly sexual mode of transmission: Secondary | ICD-10-CM | POA: Diagnosis present

## 2014-01-06 DIAGNOSIS — Z01419 Encounter for gynecological examination (general) (routine) without abnormal findings: Secondary | ICD-10-CM | POA: Diagnosis not present

## 2014-01-06 DIAGNOSIS — Z124 Encounter for screening for malignant neoplasm of cervix: Secondary | ICD-10-CM

## 2014-01-06 DIAGNOSIS — F32A Depression, unspecified: Secondary | ICD-10-CM

## 2014-01-06 DIAGNOSIS — Z0000 Encounter for general adult medical examination without abnormal findings: Secondary | ICD-10-CM

## 2014-01-06 DIAGNOSIS — F329 Major depressive disorder, single episode, unspecified: Secondary | ICD-10-CM

## 2014-01-06 MED ORDER — VENLAFAXINE HCL ER 37.5 MG PO CP24: 37.5000 mg | ORAL_CAPSULE | Freq: Every day | ORAL | Status: DC

## 2014-01-06 NOTE — Progress Notes (Signed)
Pre visit review using our clinic review tool, if applicable. No additional management support is needed unless otherwise documented below in the visit note. 

## 2014-01-06 NOTE — Patient Instructions (Signed)
Stress and Stress Management Stress is a normal reaction to life events. It is what you feel when life demands more than you are used to or more than you can handle. Some stress can be useful. For example, the stress reaction can help you catch the last bus of the day, study for a test, or meet a deadline at work. But stress that occurs too often or for too long can cause problems. It can affect your emotional health and interfere with relationships and normal daily activities. Too much stress can weaken your immune system and increase your risk for physical illness. If you already have a medical problem, stress can make it worse. CAUSES  All sorts of life events may cause stress. An event that causes stress for one person may not be stressful for another person. Major life events commonly cause stress. These may be positive or negative. Examples include losing your job, moving into a new home, getting married, having a baby, or losing a loved one. Less obvious life events may also cause stress, especially if they occur day after day or in combination. Examples include working long hours, driving in traffic, caring for children, being in debt, or being in a difficult relationship. SIGNS AND SYMPTOMS Stress may cause emotional symptoms including, the following:  Anxiety. This is feeling worried, afraid, on edge, overwhelmed, or out of control.  Anger. This is feeling irritated or impatient.  Depression. This is feeling sad, down, helpless, or guilty.  Difficulty focusing, remembering, or making decisions. Stress may cause physical symptoms, including the following:   Aches and pains. These may affect your head, neck, back, stomach, or other areas of your body.  Tight muscles or clenched jaw.  Low energy or trouble sleeping. Stress may cause unhealthy behaviors, including the following:   Eating to feel better (overeating) or skipping meals.  Sleeping too little, too much, or both.  Working  too much or putting off tasks (procrastination).  Smoking, drinking alcohol, or using drugs to feel better. DIAGNOSIS  Stress is diagnosed through an assessment by your health care provider. Your health care provider will ask questions about your symptoms and any stressful life events.Your health care provider will also ask about your medical history and may order blood tests or other tests. Certain medical conditions and medicine can cause physical symptoms similar to stress. Mental illness can cause emotional symptoms and unhealthy behaviors similar to stress. Your health care provider may refer you to a mental health professional for further evaluation.  TREATMENT  Stress management is the recommended treatment for stress.The goals of stress management are reducing stressful life events and coping with stress in healthy ways.  Techniques for reducing stressful life events include the following:  Stress identification. Self-monitor for stress and identify what causes stress for you. These skills may help you to avoid some stressful events.  Time management. Set your priorities, keep a calendar of events, and learn to say "no." These tools can help you avoid making too many commitments. Techniques for coping with stress include the following:  Rethinking the problem. Try to think realistically about stressful events rather than ignoring them or overreacting. Try to find the positives in a stressful situation rather than focusing on the negatives.  Exercise. Physical exercise can release both physical and emotional tension. The key is to find a form of exercise you enjoy and do it regularly.  Relaxation techniques. These relax the body and mind. Examples include yoga, meditation, tai chi, biofeedback, deep  breathing, progressive muscle relaxation, listening to music, being out in nature, journaling, and other hobbies. Again, the key is to find one or more that you enjoy and can do  regularly.  Healthy lifestyle. Eat a balanced diet, get plenty of sleep, and do not smoke. Avoid using alcohol or drugs to relax.  Strong support network. Spend time with family, friends, or other people you enjoy being around.Express your feelings and talk things over with someone you trust. Counseling or talktherapy with a mental health professional may be helpful if you are having difficulty managing stress on your own. Medicine is typically not recommended for the treatment of stress.Talk to your health care provider if you think you need medicine for symptoms of stress. HOME CARE INSTRUCTIONS  Keep all follow-up visits as directed by your health care provider.  Take all medicines as directed by your health care provider. SEEK MEDICAL CARE IF:  Your symptoms get worse or you start having new symptoms.  You feel overwhelmed by your problems and can no longer manage them on your own. SEEK IMMEDIATE MEDICAL CARE IF:  You feel like hurting yourself or someone else. Document Released: 07/09/2000 Document Revised: 05/30/2013 Document Reviewed: 09/07/2012 [facility] Patient Information 2015 [place], [place]. This information is not intended to replace advice given to you by your health care provider. Make sure you discuss any questions you have with your health care provider.

## 2014-01-06 NOTE — Progress Notes (Signed)
Subjective:    Patient ID: Daisy [name], female    DOB: August 02, 1980, 33 y.o.   MRN: [id number]  HPI 33 year old African-American female, nonsmoker is in today for complete physical exam with Pap smear. Was previously seen here for irregular vaginal bleeding and was scheduled to undergo an ultrasound that she has not completed today. However, feels like her menstrual cycles are becoming more regulated. Began to have some spotting this month but is due to have a menstrual cycle. Has concerns of feeling more down and depressed off and on over the last 8 years. However, would like to consider medication intervention. In the past, she has been on Zoloft, Paxil and Prozac that have all been effective. Denies any thoughts of death or dying or any intent to commit suicide.   Review of Systems  Constitutional: Negative.   HENT: Negative.   Eyes: Negative.   Respiratory: Negative.   Cardiovascular: Negative.   Gastrointestinal: Negative.   Endocrine: Negative.   Genitourinary: Negative.   Musculoskeletal: Negative.   Skin: Negative.   Allergic/Immunologic: Negative.   Neurological: Negative.   Hematological: Negative.   Psychiatric/Behavioral: Negative for suicidal ideas. The patient is nervous/anxious.        Feels down   Past Medical History  Diagnosis Date  . Hypertension     History   Social History  . Marital Status: Single    Spouse Name: N/A    Number of Children: N/A  . Years of Education: N/A   Occupational History  . Not on file.   Social History Main Topics  . Smoking status: Never Smoker   . Smokeless tobacco: Never Used  . Alcohol Use: Yes     Comment: occasionally  . Drug Use: No  . Sexual Activity: Not on file   Other Topics Concern  . Not on file   Social History Narrative    Past Surgical History  Procedure Laterality Date  . No past surgeries      No family history on file.  No Known Allergies  Current Outpatient Prescriptions on File Prior  to Visit  Medication Sig Dispense Refill  . acetaminophen (TYLENOL) 325 MG tablet Take 650 mg by mouth every 6 (six) hours as needed for headache.    . Fe Fum-FePoly-FA-Vit C-Vit B3 (INTEGRA F) 125-1 MG CAPS Take 1 tablet by mouth daily. 30 capsule 1  . hydrochlorothiazide (HYDRODIURIL) 25 MG tablet Take 25 mg by mouth daily as needed (when blood pressure is high).    . Multiple Vitamins-Minerals (MULTIVITAMIN PO) Take 1 tablet by mouth daily.    . Naproxen Sodium (ALEVE) 220 MG CAPS Take 1 capsule by mouth as needed (headaches or cramping).    . SUMAtriptan (IMITREX) 50 MG tablet Take 1 tablet (50 mg total) by mouth every 2 (two) hours as needed for migraine or headache. May repeat in 2 hours if headache persists or recurs. 10 tablet 4   No current facility-administered medications on file prior to visit.    BP 120/74 mmHg  Pulse 93  Ht 5' 5.5" (1.664 m)  Wt 181 lb 11.2 oz (82.419 kg)  BMI 29.77 kg/m2  LMP 12/10/2015chart     Objective:   Physical Exam  Constitutional: She is oriented to person, place, and time. She appears well-developed and well-nourished.  HENT:  Head: Normocephalic.  Right Ear: External ear normal.  Left Ear: External ear normal.  Nose: Nose normal.  Mouth/Throat: Oropharynx is clear and moist.  Eyes: Conjunctivae and EOM are  normal. Pupils are equal, round, and reactive to light.  Neck: Normal range of motion. Neck supple. No thyromegaly present.  Cardiovascular: Normal rate, regular rhythm and normal heart sounds.   Pulmonary/Chest: Effort normal and breath sounds normal. Right breast exhibits no inverted nipple, no mass, no nipple discharge, no skin change and no tenderness. Left breast exhibits no inverted nipple, no mass, no nipple discharge, no skin change and no tenderness. Breasts are symmetrical.  Abdominal: Soft. Bowel sounds are normal.  Genitourinary: Vagina normal and uterus normal. Guaiac negative stool.  Musculoskeletal: Normal range of motion.  She exhibits no edema or tenderness.  Neurological: She is alert and oriented to person, place, and time. She has normal reflexes. She displays normal reflexes. No cranial nerve deficit. Coordination normal.  Skin: Skin is warm and dry.          Assessment & Plan:  Daisy [name] was seen today for annual exam.  Diagnoses and associated orders for this visit:  Preventative health care  Screening for malignant neoplasm of cervix - PAP [[place]]  Depression  Other Orders - venlafaxine XR (EFFEXOR XR) 37.5 MG 24 hr capsule; Take 1 capsule (37.5 mg total) by mouth daily with breakfast.    Start Effexor 37.5 mg once daily. Call the office with any questions or concerns. Strongly encouraged exercise 45 minutes 3-4 days per week. Pap smear sent today. Consider transvaginal ultrasound that she continues to have vaginal bleeding. Encouraged to not supplement and iron rich diet. Follow-up with gynecology for possibility of Mirena insertion.

## 2014-01-09 LAB — CERVICOVAGINAL ANCILLARY ONLY
Bacterial vaginitis: NEGATIVE
Candida vaginitis: NEGATIVE

## 2014-01-09 LAB — CYTOLOGY - PAP

## 2014-02-03 ENCOUNTER — Ambulatory Visit: Payer: 59 | Admitting: Family

## 2015-03-19 DIAGNOSIS — G43009 Migraine without aura, not intractable, without status migrainosus: Secondary | ICD-10-CM | POA: Insufficient documentation

## 2017-09-10 DIAGNOSIS — Z0279 Encounter for issue of other medical certificate: Secondary | ICD-10-CM

## 2018-01-05 DIAGNOSIS — F3341 Major depressive disorder, recurrent, in partial remission: Secondary | ICD-10-CM | POA: Insufficient documentation

## 2018-09-09 ENCOUNTER — Other Ambulatory Visit: Payer: Self-pay

## 2018-09-09 ENCOUNTER — Ambulatory Visit (INDEPENDENT_AMBULATORY_CARE_PROVIDER_SITE_OTHER): Payer: 59 | Admitting: Licensed Clinical Social Worker

## 2018-09-09 DIAGNOSIS — F321 Major depressive disorder, single episode, moderate: Secondary | ICD-10-CM | POA: Diagnosis not present
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
## 2018-09-29 ENCOUNTER — Ambulatory Visit: Payer: 59 | Admitting: Licensed Clinical Social Worker

## 2018-10-19 ENCOUNTER — Ambulatory Visit (INDEPENDENT_AMBULATORY_CARE_PROVIDER_SITE_OTHER): Payer: 59 | Admitting: Licensed Clinical Social Worker

## 2018-10-19 ENCOUNTER — Other Ambulatory Visit: Payer: Self-pay

## 2018-10-19 DIAGNOSIS — F321 Major depressive disorder, single episode, moderate: Secondary | ICD-10-CM

## 2018-10-21 NOTE — Progress Notes (Signed)
Virtual Visit via Telephone Note  I connected with Daisy [name] on 10/19/18 at  8:00 AM EDT by telephone and verified that I am speaking with the correct person using two identifiers.    I discussed the limitations, risks, security and privacy concerns of performing an evaluation and management service by telephone and the availability of in person appointments. I also discussed with the patient that there may be a patient responsible charge related to this service. The patient expressed understanding and agreed to proceed.      Session Time: 30 min  Participation Level: Active  Type of Therapy: Individual Therapy  Treatment Goals addressed: Anxiety  Interventions: CBT and Motivational Interviewing  Summary: Daisy [name] is a 38 y.o. female who presents with continued symptoms of diagnosis.  Therapist met with Patient in an outpatient setting to assess current mood and assist with making progress towards goals through the use of therapeutic intervention. Therapist did a brief mood check, assessing anger, fear, disgust, excitement, happiness, and sadness  LCSW discussed what psychotherapy is and is not and the importance of the therapeutic relationship to include open and honest communication between client and therapist and building trust.  Reviewed advantages and disadvantages of the therapeutic process and limitations to the therapeutic relationship including LCSW's role in maintaining the safety of the client, others and those in client's care.   Suicidal/Homicidal: No Plan: Return again in 1 weeks.  Diagnosis: Axis I: Generalized Anxiety Disorder and Major Depression, Recurrent severe    Axis II: No diagnosis      I discussed the assessment and treatment plan with the patient. The patient was provided an opportunity to ask questions and all were answered. The patient agreed with the plan and demonstrated an understanding of the instructions.   The patient was advised to  call back or seek an in-person evaluation if the symptoms worsen or if the condition fails to improve as anticipated.  I provided 30 minutes of non-face-to-face time during this encounter.   Lubertha South, LCSW

## 2018-10-26 ENCOUNTER — Other Ambulatory Visit: Payer: Self-pay

## 2018-10-26 ENCOUNTER — Ambulatory Visit: Payer: 59 | Admitting: Licensed Clinical Social Worker

## 2018-11-09 ENCOUNTER — Encounter: Payer: Self-pay | Admitting: Psychiatry

## 2018-11-09 ENCOUNTER — Ambulatory Visit (INDEPENDENT_AMBULATORY_CARE_PROVIDER_SITE_OTHER): Payer: 59 | Admitting: Psychiatry

## 2018-11-09 ENCOUNTER — Other Ambulatory Visit: Payer: Self-pay

## 2018-11-09 DIAGNOSIS — Z79899 Other long term (current) drug therapy: Secondary | ICD-10-CM | POA: Diagnosis not present

## 2018-11-09 DIAGNOSIS — F333 Major depressive disorder, recurrent, severe with psychotic symptoms: Secondary | ICD-10-CM | POA: Insufficient documentation

## 2018-11-09 DIAGNOSIS — F4010 Social phobia, unspecified: Secondary | ICD-10-CM | POA: Diagnosis not present

## 2018-11-09 DIAGNOSIS — I10 Essential (primary) hypertension: Secondary | ICD-10-CM | POA: Insufficient documentation

## 2018-11-09 MED ORDER — QUETIAPINE FUMARATE 25 MG PO TABS: 12.5000 mg | ORAL_TABLET | Freq: Every day | ORAL | 0 refills | Status: DC

## 2018-11-09 MED ORDER — HYDROXYZINE HCL 25 MG PO TABS: 25.0000 mg | ORAL_TABLET | Freq: Two times a day (BID) | ORAL | 0 refills | Status: DC | PRN

## 2018-11-09 MED ORDER — ESCITALOPRAM OXALATE 20 MG PO TABS: 20.0000 mg | ORAL_TABLET | Freq: Every day | ORAL | 0 refills | Status: DC
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
## 2018-12-06 ENCOUNTER — Encounter: Payer: Self-pay | Admitting: Psychiatry

## 2018-12-06 ENCOUNTER — Other Ambulatory Visit: Payer: Self-pay

## 2018-12-06 ENCOUNTER — Ambulatory Visit (INDEPENDENT_AMBULATORY_CARE_PROVIDER_SITE_OTHER): Payer: 59 | Admitting: Psychiatry

## 2018-12-06 DIAGNOSIS — F333 Major depressive disorder, recurrent, severe with psychotic symptoms: Secondary | ICD-10-CM

## 2018-12-06 DIAGNOSIS — Z79899 Other long term (current) drug therapy: Secondary | ICD-10-CM

## 2018-12-06 DIAGNOSIS — F4010 Social phobia, unspecified: Secondary | ICD-10-CM

## 2018-12-06 MED ORDER — QUETIAPINE FUMARATE 50 MG PO TABS: 50.0000 mg | ORAL_TABLET | Freq: Every day | ORAL | 0 refills | Status: DC

## 2018-12-06 NOTE — Progress Notes (Signed)
Virtual Visit via Video Note  I connected with Daisy [name] on 12/06/18 at  3:30 PM EST by a video enabled telemedicine application and verified that I am speaking with the correct person using two identifiers.   I discussed the limitations of evaluation and management by telemedicine and the availability of in person appointments. The patient expressed understanding and agreed to proceed.   I discussed the assessment and treatment plan with the patient. The patient was provided an opportunity to ask questions and all were answered. The patient agreed with the plan and demonstrated an understanding of the instructions.   The patient was advised to call back or seek an in-person evaluation if the symptoms worsen or if the condition fails to improve as anticipated.   [place] MD OP Progress Note  12/06/2018 3:16 PM Daisy [name]  MRN:  [id number]  Chief Complaint:  Chief Complaint    Follow-up     HPI: Karyzma is a 38 year old African-American female, currently unemployed, lives with her boyfriend in [place], has a history of depression, hypertension, migraine headaches was evaluated by telemedicine today.  Patient today reports she has noticed improvement in her mood however she continues to struggle with lack of motivation, sadness.  She reports she is currently on the Lexapro 20 mg and is tolerating it well.  The Seroquel low dosage does help her with her sleep.  Discussed increasing the dosage of Seroquel since she continues to struggle with depressive symptoms.  Patient reports she has not had psychotherapy sessions with Ms. Peacock recently.  She however reports she will call to schedule an appointment.  Patient denies any suicidality, homicidality or perceptual disturbances.  Patient continues to spend time with her son which is a positive factor in her life.  She reports that this time her relationship with her boyfriend is going okay.  Patient denies any other concerns  today.   Visit Diagnosis:    ICD-10-CM   1. MDD (major depressive disorder), recurrent, severe, with psychosis ([place])  F33.3 QUEtiapine (SEROQUEL) 50 MG tablet  2. Social anxiety disorder  F40.10   3. High risk medication use  Z79.899     Past Psychiatric History: Reviewed past psychiatric history from my progress note on 11/09/2018.  Past trials of Lexapro, Prozac, Wellbutrin, Klonopin, Elavil  Past Medical History:  Past Medical History:  Diagnosis Date  . Anxiety   . Depression   . Hypertension   . Migraine     Past Surgical History:  Procedure Laterality Date  . NO PAST SURGERIES      Family Psychiatric History: Reviewed family psychiatric history from my progress note on 11/09/2018  Family History:  Family History  Problem Relation Age of Onset  . Alcohol abuse Maternal Aunt   . Depression Maternal Aunt   . Drug abuse Maternal Uncle   . Alcohol abuse Maternal Grandmother     Social History: Reviewed social history from my progress note on 11/09/2018 Social History   Socioeconomic History  . Marital status: Single    Spouse name: Not on file  . Number of children: 1  . Years of education: Not on file  . Highest education level: Not on file  Occupational History  . Not on file  Social Needs  . Financial resource strain: Not on file  . Food insecurity    Worry: Not on file    Inability: Not on file  . Transportation needs    Medical: Not on file    Non-medical: Not on  file  Tobacco Use  . Smoking status: Never Smoker  . Smokeless tobacco: Never Used  Substance and Sexual Activity  . Alcohol use: Yes    Comment: occasionally  . Drug use: No  . Sexual activity: Not on file  Lifestyle  . Physical activity    Days per week: Not on file    Minutes per session: Not on file  . Stress: Not on file  Relationships  . Social Herbalist on phone: Not on file    Gets together: Not on file    Attends religious service: Not on file    Active member  of club or organization: Not on file    Attends meetings of clubs or organizations: Not on file    Relationship status: Not on file  Other Topics Concern  . Not on file  Social History Narrative  . Not on file    Allergies:  Allergies  Allergen Reactions  . Atenolol     Hair loss and skin eruption    Metabolic Disorder Labs: No results found for: HGBA1C, MPG No results found for: PROLACTIN Lab Results  Component Value Date   CHOL 189 12/02/2013   TRIG 57.0 12/02/2013   HDL 46.90 12/02/2013   CHOLHDL 4 12/02/2013   VLDL 11.4 12/02/2013   LDLCALC 131 (H) 12/02/2013   Lab Results  Component Value Date   TSH 1.34 12/02/2013    Therapeutic Level Labs: No results found for: LITHIUM No results found for: VALPROATE No components found for:  CBMZ  Current Medications: Current Outpatient Medications  Medication Sig Dispense Refill  . acetaminophen (TYLENOL) 325 MG tablet Take 650 mg by mouth every 6 (six) hours as needed for headache.    [name] amLODipine (NORVASC) 5 MG tablet     . escitalopram (LEXAPRO) 20 MG tablet Take 1 tablet (20 mg total) by mouth daily. 90 tablet 0  . hydrOXYzine (ATARAX/VISTARIL) 25 MG tablet Take 1 tablet (25 mg total) by mouth 2 (two) times daily as needed for anxiety or itching. For itching , severe anxiety 180 tablet 0  . Multiple Vitamins-Minerals (MULTIVITAMIN PO) Take 1 tablet by mouth daily.    . Naproxen Sodium (ALEVE) 220 MG CAPS Take 1 capsule by mouth as needed (headaches or cramping).    . norethindrone (MICRONOR) 0.35 MG tablet     . QUEtiapine (SEROQUEL) 50 MG tablet Take 1 tablet (50 mg total) by mouth at bedtime. 90 tablet 0   No current facility-administered medications for this visit.      Musculoskeletal: Strength & Muscle Tone: UTA Gait & Station: normal Patient leans: N/A  Psychiatric Specialty Exam: Review of Systems  Psychiatric/Behavioral: Positive for depression.  All other systems reviewed and are negative.   There  were no vitals taken for this visit.There is no height or weight on file to calculate BMI.  General Appearance: Casual  Eye Contact:  Fair  Speech:  Clear and Coherent  Volume:  Normal  Mood:  Depressed  Affect:  Congruent  Thought Process:  Goal Directed and Descriptions of Associations: Intact  Orientation:  Full (Time, Place, and Person)  Thought Content: Logical   Suicidal Thoughts:  No  Homicidal Thoughts:  No  Memory:  Immediate;   Fair Recent;   Fair Remote;   Fair  Judgement:  Fair  Insight:  Fair  Psychomotor Activity:  Normal  Concentration:  Concentration: Fair and Attention Span: Fair  Recall:  [employer] of Knowledge: Fair  Language: Fair  Akathisia:  No  Handed:  Right  AIMS (if indicated): UTA  Assets:  Communication Skills Desire for Improvement Housing Social Support  ADL's:  Intact  Cognition: WNL  Sleep:  Improving   Screenings:   Assessment and Plan: Terrie is a 38 year old African-American female, unemployed, lives in [place], has a history of depression, anxiety, hypertension, migraine headaches was evaluated by telemedicine today.  She is biologically predisposed given her family history.  She also has psychosocial stressors of job loss, relationship problems, several deaths in the family, health problems of her mother.  Patient however has good support system.  Patient will continue to benefit from medication readjustment since she struggles with depression.  Plan MDD-some improvement Lexapro 20 mg p.o. daily Increase Seroquel to 50 mg p.o. nightly Continue psychotherapy sessions with Ms. Peacock  Social anxiety disorder-improving Lexapro as prescribed Continue CBT Hydroxyzine 25 mg p.o. twice daily as needed  Patient encouraged to get EKG to monitor QTC  Follow-up in clinic in 4 weeks or sooner if needed.  December 11 at 10:30 AM  I have spent atleast 15 minutes non face to face with patient today. More than 50 % of the time was spent  for psychoeducation and supportive psychotherapy and care coordination. This note was generated in part or whole with voice recognition software. Voice recognition is usually quite accurate but there are transcription errors that can and very often do occur. I apologize for any typographical errors that were not detected and corrected.       Ursula Alert, MD 12/06/2018, 3:16 PM

## 2018-12-19 ENCOUNTER — Other Ambulatory Visit: Payer: Self-pay | Admitting: Psychiatry

## 2018-12-19 DIAGNOSIS — F4010 Social phobia, unspecified: Secondary | ICD-10-CM

## 2018-12-19 DIAGNOSIS — F333 Major depressive disorder, recurrent, severe with psychotic symptoms: Secondary | ICD-10-CM

## 2018-12-20 NOTE — Telephone Encounter (Signed)
Seroquel and Lexapro were sent to pharmacy at [facility] [place] previously. She does not need refills at this time.

## 2019-01-07 ENCOUNTER — Ambulatory Visit (INDEPENDENT_AMBULATORY_CARE_PROVIDER_SITE_OTHER): Payer: Self-pay | Admitting: Psychiatry

## 2019-01-07 ENCOUNTER — Other Ambulatory Visit: Payer: Self-pay

## 2019-01-07 ENCOUNTER — Encounter: Payer: Self-pay | Admitting: Psychiatry

## 2019-01-07 DIAGNOSIS — F4010 Social phobia, unspecified: Secondary | ICD-10-CM

## 2019-01-07 DIAGNOSIS — F3341 Major depressive disorder, recurrent, in partial remission: Secondary | ICD-10-CM

## 2019-01-07 NOTE — Progress Notes (Signed)
Virtual Visit via Video Note  I connected with Daisy [name] on 01/07/19 at 10:30 AM EST by a video enabled telemedicine application and verified that I am speaking with the correct person using two identifiers.   I discussed the limitations of evaluation and management by telemedicine and the availability of in person appointments. The patient expressed understanding and agreed to proceed.     I discussed the assessment and treatment plan with the patient. The patient was provided an opportunity to ask questions and all were answered. The patient agreed with the plan and demonstrated an understanding of the instructions.   The patient was advised to call back or seek an in-person evaluation if the symptoms worsen or if the condition fails to improve as anticipated.  [place] MD OP Progress Note  01/07/2019 12:24 PM Daisy [name]  MRN:  [id number]  Chief Complaint:  Chief Complaint    Follow-up     HPI: Daisy [name] is a 38 year old African-American female, currently unemployed, lives with her boyfriend in [place], has a history of depression, social anxiety disorder, hypertension, migraine headaches was evaluated by telemedicine today.  Patient today reports her mood symptoms have improved.  She denies any significant sadness or lack of motivation at this time.  She reports the Lexapro has been helpful.  She denies side effects to the Lexapro.  She is compliant on the medication as prescribed.  She reports she continues to have breakthrough anxiety however the hydroxyzine low dosage does not seem to be helpful anymore.  She has not been to therapy sessions and feels like she does not need it.  Patient reports sleep as restless.  She reports it is only because she does not have a good sleep hygiene.  There are nights when she goes to bed very late and cannot take the Seroquel since it may keep her asleep through the next day afternoon.  And she does not want to do that.  She reports she wants  to work on her sleep hygiene.  Patient denies any suicidality, homicidality or perceptual disturbances.  She did not appear to be preoccupied with any paranoid delusions.  She reports she has interviewed for human resources position with [employer] and is hopeful that she will get the job.  Patient continues to spend her time with her family.  Patient denies any other concerns today. Visit Diagnosis:    ICD-10-CM   1. MDD (major depressive disorder), recurrent, in partial remission ([place])  F33.41   2. Social anxiety disorder  F40.10     Past Psychiatric History: Reviewed past psychiatric history from my progress note on 11/09/2018.  Past trials of Lexapro, Prozac, Wellbutrin, Klonopin, Elavil.  Past Medical History:  Past Medical History:  Diagnosis Date  . Anxiety   . Depression   . Hypertension   . Migraine     Past Surgical History:  Procedure Laterality Date  . NO PAST SURGERIES      Family Psychiatric History: Reviewed family psychiatric history from my progress note on 11/09/2018. Family History:  Family History  Problem Relation Age of Onset  . Alcohol abuse Maternal Aunt   . Depression Maternal Aunt   . Drug abuse Maternal Uncle   . Alcohol abuse Maternal Grandmother    Reviewed Social History: Reviewed social history from my progress note on 11/09/2018. Social History   Socioeconomic History  . Marital status: Single    Spouse name: Not on file  . Number of children: 1  . Years of  education: Not on file  . Highest education level: Not on file  Occupational History  . Not on file  Tobacco Use  . Smoking status: Never Smoker  . Smokeless tobacco: Never Used  Substance and Sexual Activity  . Alcohol use: Yes    Comment: occasionally  . Drug use: No  . Sexual activity: Not on file  Other Topics Concern  . Not on file  Social History Narrative  . Not on file   Social Determinants of Health   Financial Resource Strain:   . Difficulty of  Paying Living Expenses: Not on file  Food Insecurity:   . Worried About Charity fundraiser in the Last Year: Not on file  . Ran Out of Food in the Last Year: Not on file  Transportation Needs:   . Lack of Transportation (Medical): Not on file  . Lack of Transportation (Non-Medical): Not on file  Physical Activity:   . Days of Exercise per Week: Not on file  . Minutes of Exercise per Session: Not on file  Stress:   . Feeling of Stress : Not on file  Social Connections:   . Frequency of Communication with Friends and Family: Not on file  . Frequency of Social Gatherings with Friends and Family: Not on file  . Attends Religious Services: Not on file  . Active Member of Clubs or Organizations: Not on file  . Attends Archivist Meetings: Not on file  . Marital Status: Not on file    Allergies:  Allergies  Allergen Reactions  . Atenolol     Hair loss and skin eruption    Metabolic Disorder Labs: No results found for: HGBA1C, MPG No results found for: PROLACTIN Lab Results  Component Value Date   CHOL 189 12/02/2013   TRIG 57.0 12/02/2013   HDL 46.90 12/02/2013   CHOLHDL 4 12/02/2013   VLDL 11.4 12/02/2013   LDLCALC 131 (H) 12/02/2013   Lab Results  Component Value Date   TSH 1.34 12/02/2013    Therapeutic Level Labs: No results found for: LITHIUM No results found for: VALPROATE No components found for:  CBMZ  Current Medications: Current Outpatient Medications  Medication Sig Dispense Refill  . acetaminophen (TYLENOL) 325 MG tablet Take 650 mg by mouth every 6 (six) hours as needed for headache.    [name] amLODipine (NORVASC) 5 MG tablet     . escitalopram (LEXAPRO) 20 MG tablet Take 1 tablet (20 mg total) by mouth daily. 90 tablet 0  . hydrOXYzine (ATARAX/VISTARIL) 25 MG tablet Take 1 tablet (25 mg total) by mouth 2 (two) times daily as needed for anxiety or itching. For itching , severe anxiety 180 tablet 0  . Multiple Vitamins-Minerals (MULTIVITAMIN PO)  Take 1 tablet by mouth daily.    . Naproxen Sodium (ALEVE) 220 MG CAPS Take 1 capsule by mouth as needed (headaches or cramping).    . norethindrone (MICRONOR) 0.35 MG tablet     . QUEtiapine (SEROQUEL) 50 MG tablet Take 1 tablet (50 mg total) by mouth at bedtime. 90 tablet 0   No current facility-administered medications for this visit.     Musculoskeletal: Strength & Muscle Tone: UTA Gait & Station: normal Patient leans: N/A  Psychiatric Specialty Exam: Review of Systems  Psychiatric/Behavioral: Positive for sleep disturbance.  All other systems reviewed and are negative.   There were no vitals taken for this visit.There is no height or weight on file to calculate BMI.  General Appearance: Casual  Eye Contact:  Fair  Speech:  Clear and Coherent  Volume:  Normal  Mood:  Euthymic  Affect:  Congruent  Thought Process:  Goal Directed and Descriptions of Associations: Intact  Orientation:  Full (Time, Place, and Person)  Thought Content: Logical   Suicidal Thoughts:  No  Homicidal Thoughts:  No  Memory:  Immediate;   Fair Recent;   Fair Remote;   Fair  Judgement:  Fair  Insight:  Fair  Psychomotor Activity:  Normal  Concentration:  Concentration: Fair and Attention Span: Fair  Recall:  [employer] of Knowledge: Fair  Language: Fair  Akathisia:  No  Handed:  Right  AIMS (if indicated):denies tremors, rigidity  Assets:  Communication Skills Desire for Improvement Housing Intimacy Social Support Talents/Skills Transportation Vocational/Educational  ADL's:  Intact  Cognition: WNL  Sleep:  Restless   Screenings:   Assessment and Plan: Daisy [name] is a 38 year old African-American female, unemployed, lives in [place], has a history of depression, anxiety, hypertension, migraine headaches was evaluated by telemedicine today.  She is biologically predisposed given her family history.  She also has psychosocial stressors of job loss, relationship problems, several deaths  in the family, health problems of her mother.  Patient however is currently making progress.  Patient has been noncompliant with psychotherapy sessions and reports she does not need it.  Patient will continue to benefit from working on her sleep hygiene to address her sleep problems.  Plan as noted below.  Plan MDD-improving Lexapro 20 mg p.o. daily Seroquel 50 mg p.o. nightly Continue psychotherapy sessions as needed.  Patient reports she does not need it.  Social anxiety disorder-improving Lexapro as prescribed Continue CBT Hydroxyzine 25 mg p.o. twice daily as needed Discussed with patient to use 50 mg as needed if she feels a 25 mg was not effective.  Pending EKG to monitor QTC-she agrees to get it done.  Follow-up in clinic in 6 to 7 weeks or sooner if needed.  January 29 at 11 AM  I have spent atleast 15 minutes non  face to face with patient today. More than 50 % of the time was spent for psychoeducation and supportive psychotherapy and care coordination. This note was generated in part or whole with voice recognition software. Voice recognition is usually quite accurate but there are transcription errors that can and very often do occur. I apologize for any typographical errors that were not detected and corrected.       Ursula Alert, MD 01/07/2019, 12:24 PM

## 2019-02-25 ENCOUNTER — Encounter: Payer: Self-pay | Admitting: Psychiatry

## 2019-02-25 ENCOUNTER — Other Ambulatory Visit: Payer: Self-pay

## 2019-02-25 ENCOUNTER — Ambulatory Visit (INDEPENDENT_AMBULATORY_CARE_PROVIDER_SITE_OTHER): Payer: Medicaid Other | Admitting: Psychiatry

## 2019-02-25 DIAGNOSIS — F4010 Social phobia, unspecified: Secondary | ICD-10-CM | POA: Diagnosis not present

## 2019-02-25 DIAGNOSIS — F331 Major depressive disorder, recurrent, moderate: Secondary | ICD-10-CM | POA: Diagnosis not present

## 2019-02-25 MED ORDER — ESCITALOPRAM OXALATE 20 MG PO TABS: 20.0000 mg | ORAL_TABLET | Freq: Every day | ORAL | 0 refills | Status: DC

## 2019-02-25 NOTE — Progress Notes (Signed)
Provider Location : [facility] Patient Location : Home   Virtual Visit via Video Note  I connected with Daisy [name] on 02/25/19 at 11:00 AM EST by a video enabled telemedicine application and verified that I am speaking with the correct person using two identifiers.   I discussed the limitations of evaluation and management by telemedicine and the availability of in person appointments. The patient expressed understanding and agreed to proceed.      I discussed the assessment and treatment plan with the patient. The patient was provided an opportunity to ask questions and all were answered. The patient agreed with the plan and demonstrated an understanding of the instructions.   The patient was advised to call back or seek an in-person evaluation if the symptoms worsen or if the condition fails to improve as anticipated.   [facility] MD OP Progress Note  02/25/2019 12:20 PM Daisy [name]  MRN:  [id number]  Chief Complaint:  Chief Complaint    Follow-up     HPI: Daisy [name] is a 39 year old African-American female, currently unemployed, lives with her boyfriend in [place], has a history of depression, social anxiety disorder, hypertension, migraine headaches was evaluated by telemedicine today.  Patient today reports she stopped taking the Lexapro a week or more ago.  She reports she ran out of the medication.    She denies any significant depressive symptoms however does struggle with some anxiety on and off.  She reports she is going to start her new job at [employer] on Monday.  She is going to be in training for the first 4 weeks.  She reports however she is very nervous about that since she currently has flulike symptoms.  She has COVID 19 testing scheduled for Sunday.  She is planning to get a rapid testing done so she can report to her work as needed.  Patient reports she is currently going through relationship struggles with her boyfriend.  She reports she is planning to  separate from him.  She reports sleep is good.  Patient denies any suicidality, homicidality or perceptual disturbances.  Patient denies any other concerns today.   Visit Diagnosis:    ICD-10-CM   1. MDD (major depressive disorder), recurrent episode, moderate ([facility])  F33.1 escitalopram (LEXAPRO) 20 MG tablet  2. Social anxiety disorder  F40.10 escitalopram (LEXAPRO) 20 MG tablet    Past Psychiatric History: I have reviewed past psychiatric history from my progress note on 11/26/2018.  Past trials of Lexapro, Prozac, Wellbutrin, Klonopin, Elavil  Past Medical History:  Past Medical History:  Diagnosis Date  . Anxiety   . Depression   . Hypertension   . Migraine     Past Surgical History:  Procedure Laterality Date  . NO PAST SURGERIES      Family Psychiatric History: I have reviewed family psychiatric history from my progress note on 11/09/2018.  Family History:  Family History  Problem Relation Age of Onset  . Alcohol abuse Maternal Aunt   . Depression Maternal Aunt   . Drug abuse Maternal Uncle   . Alcohol abuse Maternal Grandmother     Social History: Reviewed social history from my progress note on 11/09/2018. Social History   Socioeconomic History  . Marital status: Single    Spouse name: Not on file  . Number of children: 1  . Years of education: Not on file  . Highest education level: Not on file  Occupational History  . Not on file  Tobacco Use  . Smoking status:  Never Smoker  . Smokeless tobacco: Never Used  Substance and Sexual Activity  . Alcohol use: Yes    Comment: occasionally  . Drug use: No  . Sexual activity: Not on file  Other Topics Concern  . Not on file  Social History Narrative  . Not on file   Social Determinants of Health   Financial Resource Strain:   . Difficulty of Paying Living Expenses: Not on file  Food Insecurity:   . Worried About Charity fundraiser in the Last Year: Not on file  . Ran Out of Food in the Last Year:  Not on file  Transportation Needs:   . Lack of Transportation (Medical): Not on file  . Lack of Transportation (Non-Medical): Not on file  Physical Activity:   . Days of Exercise per Week: Not on file  . Minutes of Exercise per Session: Not on file  Stress:   . Feeling of Stress : Not on file  Social Connections:   . Frequency of Communication with Friends and Family: Not on file  . Frequency of Social Gatherings with Friends and Family: Not on file  . Attends Religious Services: Not on file  . Active Member of Clubs or Organizations: Not on file  . Attends Archivist Meetings: Not on file  . Marital Status: Not on file    Allergies:  Allergies  Allergen Reactions  . Atenolol     Hair loss and skin eruption    Metabolic Disorder Labs: No results found for: HGBA1C, MPG No results found for: PROLACTIN Lab Results  Component Value Date   CHOL 189 12/02/2013   TRIG 57.0 12/02/2013   HDL 46.90 12/02/2013   CHOLHDL 4 12/02/2013   VLDL 11.4 12/02/2013   LDLCALC 131 (H) 12/02/2013   Lab Results  Component Value Date   TSH 1.34 12/02/2013    Therapeutic Level Labs: No results found for: LITHIUM No results found for: VALPROATE No components found for:  CBMZ  Current Medications: Current Outpatient Medications  Medication Sig Dispense Refill  . cephALEXin (KEFLEX) 500 MG capsule Take by mouth.    . fexofenadine-pseudoephedrine (ALLEGRA-D 24) 180-240 MG 24 hr tablet Take by mouth.    [name] acetaminophen (TYLENOL) 325 MG tablet Take 650 mg by mouth every 6 (six) hours as needed for headache.    [name] amLODipine (NORVASC) 5 MG tablet     . escitalopram (LEXAPRO) 20 MG tablet Take 1 tablet (20 mg total) by mouth daily. 90 tablet 0  . hydrOXYzine (ATARAX/VISTARIL) 25 MG tablet Take 1 tablet (25 mg total) by mouth 2 (two) times daily as needed for anxiety or itching. For itching , severe anxiety 180 tablet 0  . Multiple Vitamins-Minerals (MULTIVITAMIN PO) Take 1 tablet by  mouth daily.    . Naproxen Sodium (ALEVE) 220 MG CAPS Take 1 capsule by mouth as needed (headaches or cramping).    . norethindrone (MICRONOR) 0.35 MG tablet     . QUEtiapine (SEROQUEL) 50 MG tablet Take 1 tablet (50 mg total) by mouth at bedtime. 90 tablet 0   No current facility-administered medications for this visit.     Musculoskeletal: Strength & Muscle Tone: UTA Gait & Station: normal Patient leans: N/A  Psychiatric Specialty Exam: Review of Systems  HENT: Positive for ear pain and sinus pressure.   Psychiatric/Behavioral: The patient is nervous/anxious.   All other systems reviewed and are negative.   There were no vitals taken for this visit.There is no height or  weight on file to calculate BMI.  General Appearance: Casual  Eye Contact:  Fair  Speech:  Normal Rate  Volume:  Normal  Mood:  Anxious  Affect:  Appropriate  Thought Process:  Goal Directed and Descriptions of Associations: Intact  Orientation:  Full (Time, Place, and Person)  Thought Content: Logical   Suicidal Thoughts:  No  Homicidal Thoughts:  No  Memory:  Immediate;   Fair Recent;   Fair Remote;   Fair  Judgement:  Fair  Insight:  Good  Psychomotor Activity:  Normal  Concentration:  Concentration: Fair and Attention Span: Fair  Recall:  [employer] of Knowledge: Fair  Language: Fair  Akathisia:  No  Handed:  Right  AIMS (if indicated): Denies tremors, rigidity  Assets:  Communication Skills Desire for Improvement Housing Social Support  ADL's:  Intact  Cognition: WNL  Sleep:  Fair   Screenings:   Assessment and Plan: Daisy [name] is a 39 year old African-American female, unemployed, lives in [place], has a history of depression, anxiety, hypertension, migraine headaches was evaluated by telemedicine today.  She is biologically predisposed given her family history.  She also has psychosocial stressors of relationship struggles on the current pandemic.  Patient is currently struggling with  flulike symptoms however it is likely her symptoms could be related to her stopping her Lexapro a week ago and not tapering it down.  She however is scheduled for COVID-19 testing.  Discussed plan as noted below.  Plan MDD-improving Restart Lexapro 20 mg p.o. daily Restart Seroquel 50 mg p.o. nightly Continue psychotherapy sessions as needed.  Social anxiety disorder-unstable Restart Lexapro as prescribed Hydroxyzine 25 mg p.o. twice daily as needed Patient advised to use 50 mg as needed if 25 mg not effective.  Pending EKG to monitor QTC  Discussed referral for CBT however she reports she is not ready at this time.  She will Biochemist, clinical know.  Follow-up in clinic in 4 weeks or sooner if needed.  March 2 at 4:40 PM  I have spent atleast 20 minutes non face to face with patient today. More than 50 % of the time was spent for ordering medications and test ,psychoeducation and supportive psychotherapy and care coordination,as well as documenting clinical information in electronic health record. This note was generated in part or whole with voice recognition software. Voice recognition is usually quite accurate but there are transcription errors that can and very often do occur. I apologize for any typographical errors that were not detected and corrected.       Ursula Alert, MD 02/25/2019, 12:20 PM

## 2019-02-27 ENCOUNTER — Other Ambulatory Visit: Payer: Self-pay | Admitting: Psychiatry

## 2019-02-27 DIAGNOSIS — F333 Major depressive disorder, recurrent, severe with psychotic symptoms: Secondary | ICD-10-CM

## 2019-03-29 ENCOUNTER — Other Ambulatory Visit: Payer: Self-pay

## 2019-03-29 ENCOUNTER — Ambulatory Visit (INDEPENDENT_AMBULATORY_CARE_PROVIDER_SITE_OTHER): Payer: Medicaid Other | Admitting: Psychiatry

## 2019-03-29 DIAGNOSIS — Z5329 Procedure and treatment not carried out because of patient's decision for other reasons: Secondary | ICD-10-CM

## 2019-03-29 NOTE — Progress Notes (Signed)
No response to text with [email or web address] link.  Unable to complete call by phone .

## 2019-04-12 ENCOUNTER — Encounter: Payer: Self-pay | Admitting: Psychiatry

## 2019-04-12 ENCOUNTER — Ambulatory Visit (INDEPENDENT_AMBULATORY_CARE_PROVIDER_SITE_OTHER): Payer: Medicaid Other | Admitting: Psychiatry

## 2019-04-12 ENCOUNTER — Other Ambulatory Visit: Payer: Self-pay

## 2019-04-12 DIAGNOSIS — Z634 Disappearance and death of family member: Secondary | ICD-10-CM | POA: Diagnosis not present

## 2019-04-12 DIAGNOSIS — F331 Major depressive disorder, recurrent, moderate: Secondary | ICD-10-CM | POA: Diagnosis not present

## 2019-04-12 DIAGNOSIS — F4010 Social phobia, unspecified: Secondary | ICD-10-CM

## 2019-04-12 NOTE — Progress Notes (Signed)
Provider Location : [facility] Patient Location : [facility] Visit via Video Note  I connected with Daisy [name] on 04/12/19 at  3:00 PM EDT by a video enabled telemedicine application and verified that I am speaking with the correct person using two identifiers.   I discussed the limitations of evaluation and management by telemedicine and the availability of in person appointments. The patient expressed understanding and agreed to proceed.     I discussed the assessment and treatment plan with the patient. The patient was provided an opportunity to ask questions and all were answered. The patient agreed with the plan and demonstrated an understanding of the instructions.   The patient was advised to call back or seek an in-person evaluation if the symptoms worsen or if the condition fails to improve as anticipated.   [place] MD OP Progress Note  04/12/2019 5:35 PM Daisy [name]  MRN:  [id number]  Chief Complaint:  Chief Complaint    Follow-up     HPI: Daisy [name] is a 39 year old female, currently employed, lives with her boyfriend in [place], has a history of depression, social anxiety disorder, hypertension, migraine headache was evaluated by telemedicine today.  Patient today reports she lost her mother on Thursday.  She reports the funeral is going to be on Friday.  She reports she does not know what she feels at this time.  She does not feel depressed however does not know what to feel.  She continues to be in denial and continues to feel like it is not real.  She reports she has taken some time off from work since her work recommended that.  She wanted to go back to work to distract herself.  She however currently is thinking about going back after the funeral.  She reports she has not been sleeping well.  However she reports she may have ran out of her Seroquel.  Patient denies any suicidality, homicidality or perceptual disturbances.  Patient reports she has been compliant on her  Lexapro.  Patient denies any side effects to her medications.  She denies any other concerns today. Visit Diagnosis:    ICD-10-CM   1. MDD (major depressive disorder), recurrent episode, moderate (HCC)  F33.1   2. Social anxiety disorder  F40.10   3. Bereavement  Z63.4     Past Psychiatric History: I have reviewed past psychiatric history from my progress note on 11/26/2018.  Past trials of Lexapro, Prozac, Wellbutrin, Klonopin, Elavil  Past Medical History:  Past Medical History:  Diagnosis Date  . Anxiety   . Depression   . Hypertension   . Migraine     Past Surgical History:  Procedure Laterality Date  . NO PAST SURGERIES      Family Psychiatric History: I have reviewed family psychiatric history from my progress note on 11/26/2018  Family History:  Family History  Problem Relation Age of Onset  . Alcohol abuse Maternal Aunt   . Depression Maternal Aunt   . Drug abuse Maternal Uncle   . Alcohol abuse Maternal Grandmother     Social History: Reviewed social history from my progress note on 11/26/2018 Social History   Socioeconomic History  . Marital status: Single    Spouse name: Not on file  . Number of children: 1  . Years of education: Not on file  . Highest education level: Not on file  Occupational History  . Not on file  Tobacco Use  . Smoking status: Never Smoker  . Smokeless tobacco: Never Used  Substance and Sexual Activity  . Alcohol use: Yes    Comment: occasionally  . Drug use: No  . Sexual activity: Not on file  Other Topics Concern  . Not on file  Social History Narrative  . Not on file   Social Determinants of Health   Financial Resource Strain:   . Difficulty of Paying Living Expenses:   Food Insecurity:   . Worried About Charity fundraiser in the Last Year:   . Arboriculturist in the Last Year:   Transportation Needs:   . Film/video editor (Medical):   [name] Lack of Transportation (Non-Medical):   Physical Activity:   .  Days of Exercise per Week:   . Minutes of Exercise per Session:   Stress:   . Feeling of Stress :   Social Connections:   . Frequency of Communication with Friends and Family:   . Frequency of Social Gatherings with Friends and Family:   . Attends Religious Services:   . Active Member of Clubs or Organizations:   . Attends Archivist Meetings:   [name] Marital Status:     Allergies:  Allergies  Allergen Reactions  . Atenolol     Hair loss and skin eruption  . Cephalexin Diarrhea    Metabolic Disorder Labs: No results found for: HGBA1C, MPG No results found for: PROLACTIN Lab Results  Component Value Date   CHOL 189 12/02/2013   TRIG 57.0 12/02/2013   HDL 46.90 12/02/2013   CHOLHDL 4 12/02/2013   VLDL 11.4 12/02/2013   LDLCALC 131 (H) 12/02/2013   Lab Results  Component Value Date   TSH 1.34 12/02/2013    Therapeutic Level Labs: No results found for: LITHIUM No results found for: VALPROATE No components found for:  CBMZ  Current Medications: Current Outpatient Medications  Medication Sig Dispense Refill  . acetaminophen (TYLENOL) 325 MG tablet Take 650 mg by mouth every 6 (six) hours as needed for headache.    [name] amLODipine (NORVASC) 5 MG tablet     . escitalopram (LEXAPRO) 20 MG tablet Take 1 tablet (20 mg total) by mouth daily. 90 tablet 0  . fexofenadine-pseudoephedrine (ALLEGRA-D 24) 180-240 MG 24 hr tablet Take by mouth.    . hydrOXYzine (ATARAX/VISTARIL) 25 MG tablet Take 1 tablet (25 mg total) by mouth 2 (two) times daily as needed for anxiety or itching. For itching , severe anxiety 180 tablet 0  . Multiple Vitamins-Minerals (MULTIVITAMIN PO) Take 1 tablet by mouth daily.    . Naproxen Sodium (ALEVE) 220 MG CAPS Take 1 capsule by mouth as needed (headaches or cramping).    . norethindrone (MICRONOR) 0.35 MG tablet     . QUEtiapine (SEROQUEL) 50 MG tablet TAKE 1 TABLET BY MOUTH EVERYDAY AT BEDTIME 90 tablet 0   No current facility-administered  medications for this visit.     Musculoskeletal: Strength & Muscle Tone: UTA Gait & Station: normal Patient leans: N/A  Psychiatric Specialty Exam: Review of Systems  Psychiatric/Behavioral: Positive for sleep disturbance.  All other systems reviewed and are negative.   There were no vitals taken for this visit.There is no height or weight on file to calculate BMI.  General Appearance: Casual  Eye Contact:  Fair  Speech:  Clear and Coherent  Volume:  Normal  Mood:  Reports she does not know what she feels  Affect:  Appropriate  Thought Process:  Goal Directed and Descriptions of Associations: Intact  Orientation:  Full (Time, Place, and  Person)  Thought Content: Logical   Suicidal Thoughts:  No  Homicidal Thoughts:  No  Memory:  Immediate;   Fair Recent;   Fair Remote;   Fair  Judgement:  Fair  Insight:  Fair  Psychomotor Activity:  Normal  Concentration:  Concentration: Fair and Attention Span: Fair  Recall:  [employer] of Knowledge: Fair  Language: Fair  Akathisia:  No  Handed:  Right  AIMS (if indicated): UTA  Assets:  Communication Skills Desire for Improvement Housing Social Support Talents/Skills Transportation Vocational/Educational  ADL's:  Intact  Cognition: WNL  Sleep:  Poor   Screenings:   Assessment and Plan: Daisy [name] is a 39 year old African-American female, employed, lives in [place], has a history of depression, anxiety, hypertension, migraine headaches was evaluated by telemedicine today.  Patient is biologically predisposed given her family history.  She also has psychosocial stressors of recent death of her mother few days ago.  Patient is currently struggles with sleep.  Plan as noted below.  Plan MDD-some progress Lexapro 20 mg p.o. daily. Seroquel 50 mg p.o. nightly.  However she has been noncompliant.  Patient reports she may have ran out however per review of the EHR a prescription was sent out on 02/28/2019 to her pharmacy for 90-day  supply.  Patient advised to check her prescription bottle or call her pharmacy. Patient has been noncompliant with psychotherapy sessions and reports she is not ready to talk to anyone.  However encouraged her to make an appointment with the therapist as needed. Also provided information for grief counseling with hospice in [place].  Social anxiety disorder-some progress Lexapro as prescribed Hydroxyzine 25 mg p.o. twice daily as needed  Bereavement-unstable Provided supportive grief counseling.   EKG-pending  Follow-up in clinic in 4 weeks or sooner if needed.  I have spent atleast 20 minutes non face to face with patient today. More than 50 % of the time was spent for  ordering medications and test ,psychoeducation and supportive psychotherapy and care coordination,as well as documenting clinical information in electronic health record. This note was generated in part or whole with voice recognition software. Voice recognition is usually quite accurate but there are transcription errors that can and very often do occur. I apologize for any typographical errors that were not detected and corrected.        Ursula Alert, MD 04/12/2019, 5:35 PM

## 2019-05-10 ENCOUNTER — Ambulatory Visit: Payer: Medicaid Other | Admitting: Psychiatry

## 2019-05-30 ENCOUNTER — Telehealth: Payer: Self-pay

## 2019-05-30 DIAGNOSIS — F4010 Social phobia, unspecified: Secondary | ICD-10-CM

## 2019-05-30 DIAGNOSIS — F333 Major depressive disorder, recurrent, severe with psychotic symptoms: Secondary | ICD-10-CM

## 2019-05-30 DIAGNOSIS — F331 Major depressive disorder, recurrent, moderate: Secondary | ICD-10-CM

## 2019-05-30 NOTE — Telephone Encounter (Signed)
pt has not had medication in a couple days states that if she got to meet with dr. every month then she doesnt want to deal with it.  she states she got to work and she doesnt want to have to deal with withdrawal everytime she can not make an appt to be seen

## 2019-05-30 NOTE — Telephone Encounter (Signed)
Pt called left message that she needed refill on her medications

## 2019-05-31 MED ORDER — ESCITALOPRAM OXALATE 20 MG PO TABS: 20.0000 mg | ORAL_TABLET | Freq: Every day | ORAL | 0 refills | Status: DC

## 2019-05-31 MED ORDER — QUETIAPINE FUMARATE 50 MG PO TABS: ORAL_TABLET | ORAL | 0 refills | Status: DC

## 2019-05-31 MED ORDER — HYDROXYZINE HCL 25 MG PO TABS: 25.0000 mg | ORAL_TABLET | Freq: Two times a day (BID) | ORAL | 0 refills | Status: DC | PRN

## 2019-05-31 NOTE — Telephone Encounter (Signed)
I have sent her medications-quetiapine, Lexapro, hydroxyzine to [facility] [address].  We will advise patient to request refill at least 7 to 10 days prior to completely running out of medications, that patient does not have to go through withdrawal symptoms.  Also will advise patient to schedule an appointment as soon as possible.

## 2019-07-20 ENCOUNTER — Other Ambulatory Visit: Payer: Self-pay | Admitting: Psychiatry

## 2019-07-20 DIAGNOSIS — F4010 Social phobia, unspecified: Secondary | ICD-10-CM

## 2019-07-20 DIAGNOSIS — F333 Major depressive disorder, recurrent, severe with psychotic symptoms: Secondary | ICD-10-CM

## 2019-07-20 DIAGNOSIS — F331 Major depressive disorder, recurrent, moderate: Secondary | ICD-10-CM

## 2019-09-28 ENCOUNTER — Other Ambulatory Visit: Payer: Self-pay

## 2019-09-28 ENCOUNTER — Encounter: Payer: Self-pay | Admitting: Psychiatry

## 2019-09-28 ENCOUNTER — Telehealth (INDEPENDENT_AMBULATORY_CARE_PROVIDER_SITE_OTHER): Payer: Medicaid Other | Admitting: Psychiatry

## 2019-09-28 DIAGNOSIS — F4010 Social phobia, unspecified: Secondary | ICD-10-CM

## 2019-09-28 DIAGNOSIS — Z634 Disappearance and death of family member: Secondary | ICD-10-CM

## 2019-09-28 DIAGNOSIS — F331 Major depressive disorder, recurrent, moderate: Secondary | ICD-10-CM

## 2019-09-28 MED ORDER — TRAZODONE HCL 50 MG PO TABS: 50.0000 mg | ORAL_TABLET | Freq: Every day | ORAL | 2 refills | Status: DC

## 2019-09-28 NOTE — Patient Instructions (Signed)
Trazodone tablets What is this medicine? TRAZODONE (TRAZ oh done) is used to treat depression. This medicine may be used for other purposes; ask your health care provider or pharmacist if you have questions. COMMON BRAND NAME(S): Desyrel What should I tell my health care provider before I take this medicine? They need to know if you have any of these conditions:  attempted suicide or thinking about it  bipolar disorder  bleeding problems  glaucoma  heart disease, or previous heart attack  irregular heart beat  kidney or liver disease  low levels of sodium in the blood  an unusual or allergic reaction to trazodone, other medicines, foods, dyes or preservatives  pregnant or trying to get pregnant  breast-feeding How should I use this medicine? Take this medicine by mouth with a glass of water. Follow the directions on the prescription label. Take this medicine shortly after a meal or a light snack. Take your medicine at regular intervals. Do not take your medicine more often than directed. Do not stop taking this medicine suddenly except upon the advice of your doctor. Stopping this medicine too quickly may cause serious side effects or your condition may worsen. A special MedGuide will be given to you by the pharmacist with each prescription and refill. Be sure to read this information carefully each time. Talk to your pediatrician regarding the use of this medicine in children. Special care may be needed. Overdosage: If you think you have taken too much of this medicine contact a poison control center or emergency room at once. NOTE: This medicine is only for you. Do not share this medicine with others. What if I miss a dose? If you miss a dose, take it as soon as you can. If it is almost time for your next dose, take only that dose. Do not take double or extra doses. What may interact with this medicine? Do not take this medicine with any of the following  medications:  certain medicines for fungal infections like fluconazole, itraconazole, ketoconazole, posaconazole, voriconazole  cisapride  dronedarone  linezolid  MAOIs like Carbex, Eldepryl, Marplan, Nardil, and Parnate  mesoridazine  methylene blue (injected into a vein)  pimozide  saquinavir  thioridazine This medicine may also interact with the following medications:  alcohol  antiviral medicines for HIV or AIDS  aspirin and aspirin-like medicines  barbiturates like phenobarbital  certain medicines for blood pressure, heart disease, irregular heart beat  certain medicines for depression, anxiety, or psychotic disturbances  certain medicines for migraine headache like almotriptan, eletriptan, frovatriptan, naratriptan, rizatriptan, sumatriptan, zolmitriptan  certain medicines for seizures like carbamazepine and phenytoin  certain medicines for sleep  certain medicines that treat or prevent blood clots like dalteparin, enoxaparin, warfarin  digoxin  fentanyl  lithium  NSAIDS, medicines for pain and inflammation, like ibuprofen or naproxen  other medicines that prolong the QT interval (cause an abnormal heart rhythm) like dofetilide  rasagiline  supplements like St. John's wort, kava kava, valerian  tramadol  tryptophan This list may not describe all possible interactions. Give your health care provider a list of all the medicines, herbs, non-prescription drugs, or dietary supplements you use. Also tell them if you smoke, drink alcohol, or use illegal drugs. Some items may interact with your medicine. What should I watch for while using this medicine? Tell your doctor if your symptoms do not get better or if they get worse. Visit your doctor or health care professional for regular checks on your progress. Because it may take   several weeks to see the full effects of this medicine, it is important to continue your treatment as prescribed by your  doctor. Patients and their families should watch out for new or worsening thoughts of suicide or depression. Also watch out for sudden changes in feelings such as feeling anxious, agitated, panicky, irritable, hostile, aggressive, impulsive, severely restless, overly excited and hyperactive, or not being able to sleep. If this happens, especially at the beginning of treatment or after a change in dose, call your health care professional. You may get drowsy or dizzy. Do not drive, use machinery, or do anything that needs mental alertness until you know how this medicine affects you. Do not stand or sit up quickly, especially if you are an older patient. This reduces the risk of dizzy or fainting spells. Alcohol may interfere with the effect of this medicine. Avoid alcoholic drinks. This medicine may cause dry eyes and blurred vision. If you wear contact lenses you may feel some discomfort. Lubricating drops may help. See your eye doctor if the problem does not go away or is severe. Your mouth may get dry. Chewing sugarless gum, sucking hard candy and drinking plenty of water may help. Contact your doctor if the problem does not go away or is severe. What side effects may I notice from receiving this medicine? Side effects that you should report to your doctor or health care professional as soon as possible:  allergic reactions like skin rash, itching or hives, swelling of the face, lips, or tongue  elevated mood, decreased need for sleep, racing thoughts, impulsive behavior  confusion  fast, irregular heartbeat  feeling faint or lightheaded, falls  feeling agitated, angry, or irritable  loss of balance or coordination  painful or prolonged erections  restlessness, pacing, inability to keep still  suicidal thoughts or other mood changes  tremors  trouble sleeping  seizures  unusual bleeding or bruising Side effects that usually do not require medical attention (report to your doctor  or health care professional if they continue or are bothersome):  change in sex drive or performance  change in appetite or weight  constipation  headache  muscle aches or pains  nausea This list may not describe all possible side effects. Call your doctor for medical advice about side effects. You may report side effects to FDA at 1-800-FDA-1088. Where should I keep my medicine? Keep out of the reach of children. Store at room temperature between 15 and 30 degrees C (59 to 86 degrees F). Protect from light. Keep container tightly closed. Throw away any unused medicine after the expiration date. NOTE: This sheet is a summary. It may not cover all possible information. If you have questions about this medicine, talk to your doctor, pharmacist, or health care provider.  2020 Elsevier/Gold Standard (2018-01-05 11:46:46)  

## 2019-09-28 NOTE — Progress Notes (Signed)
Provider Location : [facility] Patient Location : Home  Participants: Patient , Provider  Virtual Visit via Video Note  I connected with Daisy [name] on 09/28/19 at  3:30 PM EDT by a video enabled telemedicine application and verified that I am speaking with the correct person using two identifiers.   I discussed the limitations of evaluation and management by telemedicine and the availability of in person appointments. The patient expressed understanding and agreed to proceed.    I discussed the assessment and treatment plan with the patient. The patient was provided an opportunity to ask questions and all were answered. The patient agreed with the plan and demonstrated an understanding of the instructions.   The patient was advised to call back or seek an in-person evaluation if the symptoms worsen or if the condition fails to improve as anticipated.   [place] MD OP Progress Note  09/28/2019 11:53 AM My Rinke  MRN:  [id number]  Chief Complaint:  Chief Complaint    Follow-up     HPI: Daisy [name] is a 39 year old female, currently employed, lives with her boyfriend in [place], has a history of depression, social anxiety disorder, hypertension, migraine headaches was evaluated by telemedicine today.  Patient today reports she is currently doing okay with regards to her depressive symptoms.  She reports she did go through an episode of not feeling good with her mood symptoms.  A lot happened since her last visit with Probation officer.  She reports she lost her job since she was fired.  She reports after she got her COVID-19 second dosage she went through an episode of possible adverse side effects.  She reports she had shortness of breath, fever, increased sweatiness, and so on which lasted for a month.  She reports she hence went to her management to get some time off from work and she reports she was fired.  She reports she however is coping well with her job loss.  She is taking this time to  recuperate.  She reports her relationship with her boyfriend as well as her sister has improved.  That definitely has helped her.  She reports even when she felt depressed she was able to push through the day, was able to get out, go for walks and that definitely helped.  She reports she continues to take the Lexapro which does help.  She takes her Seroquel only as needed and has been using it only for sleep.  She however reports recently some of her blood work-up came back positive for abnormal liver function.  She wonders whether the Seroquel could be contributing to it.  She reports she hence is currently taking melatonin more frequently for sleep at night.  It does help her to fall asleep however does not help her to maintain sleep.  She is interested in something to help her with her sleep today.  Patient denies any suicidality, homicidality or perceptual disturbances.  She continues to grieve the loss of her mother however she is coping better.  She is not interested in psychotherapy sessions at this time.  Patient denies any other concerns today.    Visit Diagnosis:    ICD-10-CM   1. MDD (major depressive disorder), recurrent episode, moderate (HCC)  F33.1 traZODone (DESYREL) 50 MG tablet  2. Social anxiety disorder  F40.10   3. Bereavement  Z63.4     Past Psychiatric History: I have reviewed past psychiatric history from my progress note on 11/26/2018.  Past trials of Prozac, Lexapro, Wellbutrin, Klonopin, Elavil,  Seroquel  Past Medical History:  Past Medical History:  Diagnosis Date  . Anxiety   . Depression   . Hypertension   . Migraine     Past Surgical History:  Procedure Laterality Date  . NO PAST SURGERIES      Family Psychiatric History: I have reviewed family psychiatric history from my progress note on 11/26/2018  Family History:  Family History  Problem Relation Age of Onset  . Alcohol abuse Maternal Aunt   . Depression Maternal Aunt   . Drug abuse Maternal  Uncle   . Alcohol abuse Maternal Grandmother     Social History: Reviewed social history from my progress note on 11/26/2018 Social History   Socioeconomic History  . Marital status: Single    Spouse name: Not on file  . Number of children: 1  . Years of education: Not on file  . Highest education level: Not on file  Occupational History  . Not on file  Tobacco Use  . Smoking status: Never Smoker  . Smokeless tobacco: Never Used  Substance and Sexual Activity  . Alcohol use: Yes    Comment: occasionally  . Drug use: No  . Sexual activity: Not on file  Other Topics Concern  . Not on file  Social History Narrative  . Not on file   Social Determinants of Health   Financial Resource Strain:   . Difficulty of Paying Living Expenses: Not on file  Food Insecurity:   . Worried About Charity fundraiser in the Last Year: Not on file  . Ran Out of Food in the Last Year: Not on file  Transportation Needs:   . Lack of Transportation (Medical): Not on file  . Lack of Transportation (Non-Medical): Not on file  Physical Activity:   . Days of Exercise per Week: Not on file  . Minutes of Exercise per Session: Not on file  Stress:   . Feeling of Stress : Not on file  Social Connections:   . Frequency of Communication with Friends and Family: Not on file  . Frequency of Social Gatherings with Friends and Family: Not on file  . Attends Religious Services: Not on file  . Active Member of Clubs or Organizations: Not on file  . Attends Archivist Meetings: Not on file  . Marital Status: Not on file    Allergies:  Allergies  Allergen Reactions  . Atenolol     Hair loss and skin eruption Hair loss and skin eruption Hair loss and skin eruption Hair loss and skin eruption Hair loss and skin eruption   . Cephalexin Diarrhea    Metabolic Disorder Labs: No results found for: HGBA1C, MPG No results found for: PROLACTIN Lab Results  Component Value Date   CHOL 189  12/02/2013   TRIG 57.0 12/02/2013   HDL 46.90 12/02/2013   CHOLHDL 4 12/02/2013   VLDL 11.4 12/02/2013   LDLCALC 131 (H) 12/02/2013   Lab Results  Component Value Date   TSH 1.34 12/02/2013    Therapeutic Level Labs: No results found for: LITHIUM No results found for: VALPROATE No components found for:  CBMZ  Current Medications: Current Outpatient Medications  Medication Sig Dispense Refill  . acetaminophen (TYLENOL) 325 MG tablet Take 650 mg by mouth every 6 (six) hours as needed for headache.    [name] amLODipine (NORVASC) 5 MG tablet     . escitalopram (LEXAPRO) 20 MG tablet TAKE 1 TABLET BY MOUTH EVERY DAY 90 tablet 1  .  fexofenadine-pseudoephedrine (ALLEGRA-D 24) 180-240 MG 24 hr tablet Take by mouth.    . fluticasone (FLONASE) 50 MCG/ACT nasal spray Place 1 spray into both nostrils daily.    . hydrOXYzine (ATARAX/VISTARIL) 25 MG tablet TAKE 1 TABLET 2 (TWO) TIMES DAILY AS NEEDED FOR ANXIETY OR ITCHING. FOR ITCHING , SEVERE ANXIETY 180 tablet 1  . Multiple Vitamins-Minerals (MULTIVITAMIN PO) Take 1 tablet by mouth daily.    . Naproxen Sodium (ALEVE) 220 MG CAPS Take 1 capsule by mouth as needed (headaches or cramping).    . norethindrone (MICRONOR) 0.35 MG tablet     . Olopatadine HCl 0.2 % SOLN Apply 1 drop to eye 2 (two) times daily.    . traZODone (DESYREL) 50 MG tablet Take 1 tablet (50 mg total) by mouth at bedtime. For sleep 30 tablet 2   No current facility-administered medications for this visit.     Musculoskeletal: Strength & Muscle Tone: UTA Gait & Station: normal Patient leans: N/A  Psychiatric Specialty Exam: Review of Systems  Skin:       Hair loss  Psychiatric/Behavioral: Positive for sleep disturbance. The patient is nervous/anxious.   All other systems reviewed and are negative.   There were no vitals taken for this visit.There is no height or weight on file to calculate BMI.  General Appearance: Casual  Eye Contact:  Fair  Speech:  Clear and  Coherent  Volume:  Normal  Mood:  Anxious coping okay  Affect:  Congruent  Thought Process:  Goal Directed and Descriptions of Associations: Intact  Orientation:  Full (Time, Place, and Person)  Thought Content: Logical   Suicidal Thoughts:  No  Homicidal Thoughts:  No  Memory:  Immediate;   Fair Recent;   Fair Remote;   Fair  Judgement:  Fair  Insight:  Fair  Psychomotor Activity:  Normal  Concentration:  Concentration: Fair and Attention Span: Fair  Recall:  [employer] of Knowledge: Fair  Language: Fair  Akathisia:  No  Handed:  Right  AIMS (if indicated): UTA  Assets:  Communication Skills Desire for Improvement Housing Social Support  ADL's:  Intact  Cognition: WNL  Sleep:  Poor difficulty maintaining sleep   Screenings:   Assessment and Plan: Marcie Shearon is a 39 year old African-American female, employed, lives in [place], has a history of depression, anxiety, hypertension, migraine headaches was evaluated by telemedicine today.  Patient is biologically predisposed given her family history.  Patient with psychosocial stressors of recent death of her mother as well as job loss.  Patient is currently making progress however continues to struggle with sleep.  Plan as noted below.  Plan MDD-stable Lexapro 20 mg p.o. daily Discontinue Seroquel. Start trazodone 50 mg p.o. nightly as needed for sleep Continue melatonin 3 to 5 mg p.o. nightly. Continue sleep hygiene techniques.  Social anxiety disorder-improving Lexapro as prescribed Hydroxyzine 25 mg p.o. twice daily as needed  Bereavement-improving Patient was referred to grief counseling in the past-she is not interested  Patient with recent abnormal liver function test-discussed discontinuing Seroquel.  Advised patient to follow-up with her primary care provider for further management.  Patient also reports some sporadic hair loss-has upcoming appointment with dermatologist.  Follow-up in clinic in 4 to 6  weeks or sooner if needed.  I have spent atleast 20 minutes face to face with patient today. More than 50 % of the time was spent for preparing to see the patient ( e.g., review of test, records ),  ordering medications and test ,  psychoeducation and supportive psychotherapy and care coordination,as well as documenting clinical information in electronic health record. This note was generated in part or whole with voice recognition software. Voice recognition is usually quite accurate but there are transcription errors that can and very often do occur. I apologize for any typographical errors that were not detected and corrected.         Ursula Alert, MD 09/29/2019, 8:02 AM

## 2019-10-29 ENCOUNTER — Other Ambulatory Visit: Payer: Self-pay | Admitting: Psychiatry

## 2019-10-29 DIAGNOSIS — F331 Major depressive disorder, recurrent, moderate: Secondary | ICD-10-CM

## 2019-11-04 ENCOUNTER — Telehealth: Payer: Medicaid Other | Admitting: Psychiatry

## 2020-02-24 ENCOUNTER — Other Ambulatory Visit: Payer: Self-pay | Admitting: Psychiatry

## 2020-02-24 DIAGNOSIS — F331 Major depressive disorder, recurrent, moderate: Secondary | ICD-10-CM

## 2020-02-24 DIAGNOSIS — F4010 Social phobia, unspecified: Secondary | ICD-10-CM

## 2020-03-07 ENCOUNTER — Encounter: Payer: Self-pay | Admitting: Psychiatry

## 2020-03-07 ENCOUNTER — Other Ambulatory Visit: Payer: Self-pay

## 2020-03-07 ENCOUNTER — Telehealth (INDEPENDENT_AMBULATORY_CARE_PROVIDER_SITE_OTHER): Payer: No Typology Code available for payment source | Admitting: Psychiatry

## 2020-03-07 DIAGNOSIS — F331 Major depressive disorder, recurrent, moderate: Secondary | ICD-10-CM | POA: Diagnosis not present

## 2020-03-07 DIAGNOSIS — Z634 Disappearance and death of family member: Secondary | ICD-10-CM

## 2020-03-07 DIAGNOSIS — F4010 Social phobia, unspecified: Secondary | ICD-10-CM | POA: Diagnosis not present

## 2020-03-07 NOTE — Progress Notes (Signed)
Virtual Visit via Video Note  I connected with Daisy [name] on 03/07/20 at  4:40 PM EST by a video enabled telemedicine application and verified that I am speaking with the correct person using two identifiers.  Location Provider Location : [facility] Patient Location : Home  Participants: Patient , Provider   I discussed the limitations of evaluation and management by telemedicine and the availability of in person appointments. The patient expressed understanding and agreed to proceed. I discussed the assessment and treatment plan with the patient. The patient was provided an opportunity to ask questions and all were answered. The patient agreed with the plan and demonstrated an understanding of the instructions.   The patient was advised to call back or seek an in-person evaluation if the symptoms worsen or if the condition fails to improve as anticipated.  [facility] MD OP Progress Note  03/07/2020 5:19 PM Chailyn Racette  MRN:  [id number]  Chief Complaint:  Chief Complaint    Follow-up     HPI: Reed Eifert is a 40 year old female, currently employed, lives with her boyfriend in [place], has a history of depression, social anxiety disorder, hypertension, migraine headaches was evaluated by telemedicine today.  Patient was last evaluated on 09/28/2019.  Patient today returns reporting she is not doing well.  She reports she tested positive for COVID-19 on January 25.  She reports she continues to struggle with a lot of fatigue.  She however reports she is back at work and has been trying to function.Patient reports her whole family tested positive for COVID and is recovering.  Patient reports she is currently struggling with sleep problems, sadness, lack of energy, anhedonia, concentration problems and so on.  This has been getting worse since the past few months.  Patient reports she struggles with her memory often.  She is very forgetful and struggles with short-term memory loss.  She  reports this has been going on for a while and could be getting worse.  Patient reports even though she is prescribed trazodone she does not take it every night.  She also does not have a good sleep hygiene and currently struggles with sleep problems.  Patient reports she is not interested in psychotherapy sessions since the last time she tried it did not work for her.  She is currently going through potential separation with her boyfriend.  She reports they have decided to part their ways and he will be moving out soon.  Patient denies any suicidality, homicidality or perceptual disturbances.  Patient denies any other concerns today.  Visit Diagnosis:    ICD-10-CM   1. MDD (major depressive disorder), recurrent episode, moderate (HCC)  F33.1   2. Social anxiety disorder  F40.10   3. Bereavement  Z63.4     Past Psychiatric History: I have reviewed past psychiatric history from my progress note on 11/26/2018.  Past trials of Prozac, Lexapro, Wellbutrin, Elavil, Seroquel, Klonopin.  Past Medical History:  Past Medical History:  Diagnosis Date  . Anxiety   . Depression   . Hypertension   . Migraine     Past Surgical History:  Procedure Laterality Date  . NO PAST SURGERIES      Family Psychiatric History: I have reviewed family psychiatric history from my progress note on 11/26/2018  Family History:  Family History  Problem Relation Age of Onset  . Alcohol abuse Maternal Aunt   . Depression Maternal Aunt   . Drug abuse Maternal Uncle   . Alcohol abuse Maternal Grandmother  Social History: Reviewed social history from my progress note on 11/26/2018 Social History   Socioeconomic History  . Marital status: Single    Spouse name: Not on file  . Number of children: 1  . Years of education: Not on file  . Highest education level: Not on file  Occupational History  . Not on file  Tobacco Use  . Smoking status: Never Smoker  . Smokeless tobacco: Never Used   Substance and Sexual Activity  . Alcohol use: Yes    Comment: occasionally  . Drug use: No  . Sexual activity: Not on file  Other Topics Concern  . Not on file  Social History Narrative  . Not on file   Social Determinants of Health   Financial Resource Strain: Not on file  Food Insecurity: Not on file  Transportation Needs: Not on file  Physical Activity: Not on file  Stress: Not on file  Social Connections: Not on file    Allergies:  Allergies  Allergen Reactions  . Atenolol     Hair loss and skin eruption Hair loss and skin eruption Hair loss and skin eruption Hair loss and skin eruption Hair loss and skin eruption   . Cephalexin Diarrhea    Metabolic Disorder Labs: No results found for: HGBA1C, MPG No results found for: PROLACTIN Lab Results  Component Value Date   CHOL 189 12/02/2013   TRIG 57.0 12/02/2013   HDL 46.90 12/02/2013   CHOLHDL 4 12/02/2013   VLDL 11.4 12/02/2013   LDLCALC 131 (H) 12/02/2013   Lab Results  Component Value Date   TSH 1.34 12/02/2013    Therapeutic Level Labs: No results found for: LITHIUM No results found for: VALPROATE No components found for:  CBMZ  Current Medications: Current Outpatient Medications  Medication Sig Dispense Refill  . acetaminophen (TYLENOL) 325 MG tablet Take 650 mg by mouth every 6 (six) hours as needed for headache.    [name] acyclovir (ZOVIRAX) 400 MG tablet Take 400 mg by mouth 3 (three) times daily.    [name] amLODipine (NORVASC) 5 MG tablet     . azithromycin (ZITHROMAX) 250 MG tablet Take 250 mg by mouth as directed.    . [employer] STOOL SOFTENER 100 MG capsule Take by mouth daily.    [name] escitalopram (LEXAPRO) 20 MG tablet TAKE 1 TABLET BY MOUTH EVERY DAY 90 tablet 1  . fluconazole (DIFLUCAN) 100 MG tablet Take by mouth.    . fluticasone (FLONASE) 50 MCG/ACT nasal spray Place 1 spray into both nostrils daily.    . hydrOXYzine (ATARAX/VISTARIL) 25 MG tablet TAKE 1 TABLET 2 (TWO) TIMES DAILY AS NEEDED FOR  ANXIETY OR ITCHING. FOR ITCHING , SEVERE ANXIETY 180 tablet 1  . Multiple Vitamins-Minerals (MULTIVITAMIN PO) Take 1 tablet by mouth daily.    . Naproxen Sodium (ALEVE) 220 MG CAPS Take 1 capsule by mouth as needed (headaches or cramping).    . norethindrone (MICRONOR) 0.35 MG tablet     . Olopatadine HCl 0.2 % SOLN Apply 1 drop to eye 2 (two) times daily.    [name] terbinafine (LAMISIL) 250 MG tablet     . traZODone (DESYREL) 50 MG tablet TAKE 1 TABLET (50 MG TOTAL) BY MOUTH AT BEDTIME FOR SLEEP 90 tablet 1  . Vitamin D, Ergocalciferol, (DRISDOL) 1.25 MG (50000 UNIT) CAPS capsule Take 50,000 Units by mouth once a week.     No current facility-administered medications for this visit.     Musculoskeletal: Strength & Muscle Tone: UTA  Gait & Station: normal Patient leans: N/A  Psychiatric Specialty Exam: Review of Systems  Constitutional: Positive for fatigue.  Psychiatric/Behavioral: Positive for decreased concentration, dysphoric mood and sleep disturbance. The patient is nervous/anxious.   All other systems reviewed and are negative.   There were no vitals taken for this visit.There is no height or weight on file to calculate BMI.  General Appearance: Casual  Eye Contact:  Fair  Speech:  Normal Rate  Volume:  Normal  Mood:  Anxious and Depressed  Affect:  Congruent  Thought Process:  Goal Directed and Descriptions of Associations: Intact  Orientation:  Full (Time, Place, and Person)  Thought Content: Logical   Suicidal Thoughts:  No  Homicidal Thoughts:  No  Memory:  Immediate;   Fair Recent;   Fair Remote;   Fair- Short term memory loss  Judgement:  Fair  Insight:  Fair  Psychomotor Activity:  Normal  Concentration:  Concentration: Fair and Attention Span: Fair  Recall:  [employer] of Knowledge: Fair  Language: Fair  Akathisia:  No  Handed:  Right  AIMS (if indicated): UTA  Assets:  Communication Skills Desire for Improvement Housing Social Support Talents/Skills   ADL's:  Intact  Cognition: WNL  Sleep:  Poor   Screenings: PHQ2-9   Flowsheet Row Video Visit from 03/07/2020 in [facility]  PHQ-2 Total Score 4  PHQ-9 Total Score 15    Flowsheet Row Video Visit from 03/07/2020 in [facility] No Risk       Assessment and Plan: Jarielys Girardot is a 40 year old African-American female, employed, lives in [place], has a history of depression, anxiety, bereavement, hypertension, migraine headaches was evaluated by telemedicine today.  Patient is biologically predisposed given her family history.  Patient with social stressors of recent death of her mother, being infected with COVID-19 recently, relationship struggles with her partner, continues to struggle with sleep, fatigue as well as depression and anxiety symptoms.  She will benefit from the following plan.  Plan MDD-unstable PHQ 9 today equals 15 However it is likely her symptoms also due to her recent COVID-19 infection from which she is currently recovering.  Patient today not interested in making medication changes and wants to wait. Continue Lexapro 20 mg p.o. daily Continue trazodone 50 mg p.o. nightly as needed for sleep.  She reports she has been noncompliant. Continue sleep hygiene techniques.   Social anxiety disorder-unstable Lexapro as prescribed Hydroxyzine 25 mg p.o. twice daily as needed Discussed referral for CBT, patient wants to think about it.  Bereavement-improving We will continue to monitor closely  Patient with memory changes, forgetfulness likely multifactorial including sleep deprived ration, depression, recent COVID-19 infection.  Discussed referral for neurology.  Patient prefers to wait.  Follow-up in clinic in 3 to 4 weeks or sooner if needed.  Patient with history of noncompliance encouraged to schedule an appointment.  She agrees to check her work schedule and call back to schedule a  follow-up session.  I have spent atleast 20 minutes face to face by video with patient today. More than 50 % of the time was spent for preparing to see the patient ( e.g., review of test, records ), , ordering medications and test ,psychoeducation and supportive psychotherapy and care coordination,as well as documenting clinical information in electronic health record. This note was generated in part or whole with voice recognition software. Voice recognition is usually quite accurate but there are transcription errors that can and  very often do occur. I apologize for any typographical errors that were not detected and corrected.          Ursula Alert, MD 03/08/2020, 5:25 PM

## 2020-06-05 ENCOUNTER — Encounter: Payer: Self-pay | Admitting: Psychiatry

## 2020-06-05 ENCOUNTER — Telehealth (INDEPENDENT_AMBULATORY_CARE_PROVIDER_SITE_OTHER): Payer: No Typology Code available for payment source | Admitting: Psychiatry

## 2020-06-05 ENCOUNTER — Telehealth (HOSPITAL_COMMUNITY): Payer: Self-pay | Admitting: Psychiatry

## 2020-06-05 ENCOUNTER — Other Ambulatory Visit: Payer: Self-pay

## 2020-06-05 DIAGNOSIS — R413 Other amnesia: Secondary | ICD-10-CM | POA: Diagnosis not present

## 2020-06-05 DIAGNOSIS — F331 Major depressive disorder, recurrent, moderate: Secondary | ICD-10-CM

## 2020-06-05 DIAGNOSIS — F4010 Social phobia, unspecified: Secondary | ICD-10-CM

## 2020-06-05 MED ORDER — TRAZODONE HCL 50 MG PO TABS: 75.0000 mg | ORAL_TABLET | Freq: Every evening | ORAL | 1 refills | Status: DC | PRN

## 2020-06-05 MED ORDER — DULOXETINE HCL 20 MG PO CPEP: 20.0000 mg | ORAL_CAPSULE | Freq: Every day | ORAL | 0 refills | Status: DC

## 2020-06-05 MED ORDER — ESCITALOPRAM OXALATE 10 MG PO TABS: 10.0000 mg | ORAL_TABLET | Freq: Every day | ORAL | 0 refills | Status: DC

## 2020-06-05 NOTE — Patient Instructions (Addendum)
Duloxetine Delayed-Release Capsules What is this medicine? DULOXETINE (doo LOX e teen) is used to treat depression, anxiety, and different types of chronic pain. This medicine may be used for other purposes; ask your health care provider or pharmacist if you have questions. COMMON BRAND NAME(S): Cymbalta, Creig Hines, Irenka What should I tell my health care provider before I take this medicine? They need to know if you have any of these conditions:  bipolar disorder  glaucoma  high blood pressure  kidney disease  liver disease  seizures  suicidal thoughts, plans or attempt; a previous suicide attempt by you or a family member  take medicines that treat or prevent blood clots  taken medicines called MAOIs like Carbex, Eldepryl, Marplan, Nardil, and Parnate within 14 days  trouble passing urine  an unusual reaction to duloxetine, other medicines, foods, dyes, or preservatives  pregnant or trying to get pregnant  breast-feeding How should I use this medicine? Take this medicine by mouth with a glass of water. Follow the directions on the prescription label. Do not crush, cut or chew some capsules of this medicine. Some capsules may be opened and sprinkled on applesauce. Check with your doctor or pharmacist if you are not sure. You can take this medicine with or without food. Take your medicine at regular intervals. Do not take your medicine more often than directed. Do not stop taking this medicine suddenly except upon the advice of your doctor. Stopping this medicine too quickly may cause serious side effects or your condition may worsen. A special MedGuide will be given to you by the pharmacist with each prescription and refill. Be sure to read this information carefully each time. Talk to your pediatrician regarding the use of this medicine in children. While this drug may be prescribed for children as young as 55 years of age for selected conditions, precautions do  apply. Overdosage: If you think you have taken too much of this medicine contact a poison control center or emergency room at once. NOTE: This medicine is only for you. Do not share this medicine with others. What if I miss a dose? If you miss a dose, take it as soon as you can. If it is almost time for your next dose, take only that dose. Do not take double or extra doses. What may interact with this medicine? Do not take this medicine with any of the following medications:  desvenlafaxine  levomilnacipran  linezolid  MAOIs like Carbex, Eldepryl, Emsam, Marplan, Nardil, and Parnate  methylene blue (injected into a vein)  milnacipran  safinamide  thioridazine  venlafaxine  viloxazine This medicine may also interact with the following medications:  alcohol  amphetamines  aspirin and aspirin-like medicines  certain antibiotics like ciprofloxacin and enoxacin  certain medicines for blood pressure, heart disease, irregular heart beat  certain medicines for depression, anxiety, or psychotic disturbances  certain medicines for migraine headache like almotriptan, eletriptan, frovatriptan, naratriptan, rizatriptan, sumatriptan, zolmitriptan  certain medicines that treat or prevent blood clots like warfarin, enoxaparin, and dalteparin  cimetidine  fentanyl  lithium  NSAIDS, medicines for pain and inflammation, like ibuprofen or naproxen  phentermine  procarbazine  rasagiline  sibutramine  St. John's wort  theophylline  tramadol  tryptophan This list may not describe all possible interactions. Give your health care provider a list of all the medicines, herbs, non-prescription drugs, or dietary supplements you use. Also tell them if you smoke, drink alcohol, or use illegal drugs. Some items may interact with your medicine. What  should I watch for while using this medicine? Tell your doctor if your symptoms do not get better or if they get worse. Visit your  doctor or healthcare provider for regular checks on your progress. Because it may take several weeks to see the full effects of this medicine, it is important to continue your treatment as prescribed by your doctor. This medicine may cause serious skin reactions. They can happen weeks to months after starting the medicine. Contact your healthcare provider right away if you notice fevers or flu-like symptoms with a rash. The rash may be red or purple and then turn into blisters or peeling of the skin. Or, you might notice a red rash with swelling of the face, lips, or lymph nodes in your neck or under your arms. Patients and their families should watch out for new or worsening thoughts of suicide or depression. Also watch out for sudden changes in feelings such as feeling anxious, agitated, panicky, irritable, hostile, aggressive, impulsive, severely restless, overly excited and hyperactive, or not being able to sleep. If this happens, especially at the beginning of treatment or after a change in dose, call your healthcare provider. You may get drowsy or dizzy. Do not drive, use machinery, or do anything that needs mental alertness until you know how this medicine affects you. Do not stand or sit up quickly, especially if you are an older patient. This reduces the risk of dizzy or fainting spells. Alcohol may interfere with the effect of this medicine. Avoid alcoholic drinks. This medicine can cause an increase in blood pressure. This medicine can also cause a sudden drop in your blood pressure, which may make you feel faint and increase the chance of a fall. These effects are most common when you first start the medicine or when the dose is increased, or during use of other medicines that can cause a sudden drop in blood pressure. Check with your doctor for instructions on monitoring your blood pressure while taking this medicine. Your mouth may get dry. Chewing sugarless gum or sucking hard candy, and drinking  plenty of water, may help. Contact your doctor if the problem does not go away or is severe. What side effects may I notice from receiving this medicine? Side effects that you should report to your doctor or health care professional as soon as possible:  allergic reactions like skin rash, itching or hives, swelling of the face, lips, or tongue  anxious  breathing problems  confusion  changes in vision  chest pain  confusion  elevated mood, decreased need for sleep, racing thoughts, impulsive behavior  eye pain  fast, irregular heartbeat  feeling faint or lightheaded, falls  feeling agitated, angry, or irritable  hallucination, loss of contact with reality  high blood pressure  loss of balance or coordination  palpitations  redness, blistering, peeling or loosening of the skin, including inside the mouth  restlessness, pacing, inability to keep still  seizures  stiff muscles  suicidal thoughts or other mood changes  trouble passing urine or change in the amount of urine  trouble sleeping  unusual bleeding or bruising  unusually weak or tired  vomiting  yellowing of the eyes or skin Side effects that usually do not require medical attention (report to your doctor or health care professional if they continue or are bothersome):  change in sex drive or performance  change in appetite or weight  constipation  dizziness  dry mouth  headache  increased sweating  nausea  tired  This list may not describe all possible side effects. Call your doctor for medical advice about side effects. You may report side effects to FDA at 1-800-FDA-1088. Where should I keep my medicine? Keep out of the reach of children and pets. Store at room temperature between 15 and 30 degrees C (59 to 86 degrees F). Get rid of any unused medicine after the expiration date. To get rid of medicines that are no longer needed or have expired:  Take the medicine to a medicine  take-back program. Check with your pharmacy or law enforcement to find a location.  If you cannot return the medicine, check the label or package insert to see if the medicine should be thrown out in the garbage or flushed down the toilet. If you are not sure, ask your health care provider. If it is safe to put it in the trash, take the medicine out of the container. Mix the medicine with cat litter, dirt, coffee grounds, or other unwanted substance. Seal the mixture in a bag or container. Put it in the trash. NOTE: This sheet is a summary. It may not cover all possible information. If you have questions about this medicine, talk to your doctor, pharmacist, or health care provider.  2021 Elsevier/Gold Standard (2019-12-01 16:06:16)  Insomnia Insomnia is a sleep disorder that makes it difficult to fall asleep or stay asleep. Insomnia can cause fatigue, low energy, difficulty concentrating, mood swings, and poor performance at work or school. There are three different ways to classify insomnia:  Difficulty falling asleep.  Difficulty staying asleep.  Waking up too early in the morning. Any type of insomnia can be long-term (chronic) or short-term (acute). Both are common. Short-term insomnia usually lasts for three months or less. Chronic insomnia occurs at least three times a week for longer than three months. What are the causes? Insomnia may be caused by another condition, situation, or substance, such as:  Anxiety.  Certain medicines.  Gastroesophageal reflux disease (GERD) or other gastrointestinal conditions.  Asthma or other breathing conditions.  Restless legs syndrome, sleep apnea, or other sleep disorders.  Chronic pain.  Menopause.  Stroke.  Abuse of alcohol, tobacco, or illegal drugs.  Mental health conditions, such as depression.  Caffeine.  Neurological disorders, such as Alzheimer's disease.  An overactive thyroid (hyperthyroidism). Sometimes, the cause of  insomnia may not be known. What increases the risk? Risk factors for insomnia include:  Gender. Women are affected more often than men.  Age. Insomnia is more common as you get older.  Stress.  Lack of exercise.  Irregular work schedule or working night shifts.  Traveling between different time zones.  Certain medical and mental health conditions. What are the signs or symptoms? If you have insomnia, the main symptom is having trouble falling asleep or having trouble staying asleep. This may lead to other symptoms, such as:  Feeling fatigued or having low energy.  Feeling nervous about going to sleep.  Not feeling rested in the morning.  Having trouble concentrating.  Feeling irritable, anxious, or depressed. How is this diagnosed? This condition may be diagnosed based on:  Your symptoms and medical history. Your health care provider may ask about: ? Your sleep habits. ? Any medical conditions you have. ? Your mental health.  A physical exam. How is this treated? Treatment for insomnia depends on the cause. Treatment may focus on treating an underlying condition that is causing insomnia. Treatment may also include:  Medicines to help you sleep.  Counseling  or therapy.  Lifestyle adjustments to help you sleep better. Follow these instructions at home: Eating and drinking  Limit or avoid alcohol, caffeinated beverages, and cigarettes, especially close to bedtime. These can disrupt your sleep.  Do not eat a large meal or eat spicy foods right before bedtime. This can lead to digestive discomfort that can make it hard for you to sleep.   Sleep habits  Keep a sleep diary to help you and your health care provider figure out what could be causing your insomnia. Write down: ? When you sleep. ? When you wake up during the night. ? How well you sleep. ? How rested you feel the next day. ? Any side effects of medicines you are taking. ? What you eat and drink.  Make  your bedroom a dark, comfortable place where it is easy to fall asleep. ? Put up shades or blackout curtains to block light from outside. ? Use a white noise machine to block noise. ? Keep the temperature cool.  Limit screen use before bedtime. This includes: ? Watching TV. ? Using your smartphone, tablet, or computer.  Stick to a routine that includes going to bed and waking up at the same times every day and night. This can help you fall asleep faster. Consider making a quiet activity, such as reading, part of your nighttime routine.  Try to avoid taking naps during the day so that you sleep better at night.  Get out of bed if you are still awake after 15 minutes of trying to sleep. Keep the lights down, but try reading or doing a quiet activity. When you feel sleepy, go back to bed.   General instructions  Take over-the-counter and prescription medicines only as told by your health care provider.  Exercise regularly, as told by your health care provider. Avoid exercise starting several hours before bedtime.  Use relaxation techniques to manage stress. Ask your health care provider to suggest some techniques that may work well for you. These may include: ? Breathing exercises. ? Routines to release muscle tension. ? Visualizing peaceful scenes.  Make sure that you drive carefully. Avoid driving if you feel very sleepy.  Keep all follow-up visits as told by your health care provider. This is important. Contact a health care provider if:  You are tired throughout the day.  You have trouble in your daily routine due to sleepiness.  You continue to have sleep problems, or your sleep problems get worse. Get help right away if:  You have serious thoughts about hurting yourself or someone else. If you ever feel like you may hurt yourself or others, or have thoughts about taking your own life, get help right away. You can go to your nearest emergency department or call:  Your local  emergency services (911 in the [place]  A suicide crisis helpline, such as the [facility] at (270) 659-5086. This is open 24 hours a day. Summary  Insomnia is a sleep disorder that makes it difficult to fall asleep or stay asleep.  Insomnia can be long-term (chronic) or short-term (acute).  Treatment for insomnia depends on the cause. Treatment may focus on treating an underlying condition that is causing insomnia.  Keep a sleep diary to help you and your health care provider figure out what could be causing your insomnia. This information is not intended to replace advice given to you by your health care provider. Make sure you discuss any questions you have with your health care  provider. Document Revised: 11/24/2019 Document Reviewed: 11/24/2019 Elsevier Patient Education  2021 [place].  Serotonin Syndrome Serotonin is a chemical in your body (neurotransmitter) that helps to control several functions, such as:  Brain and nerve cell function.  Mood and emotions.  Memory.  Eating.  Sleeping.  Sexual activity.  Stress response. Having too much serotonin in your body can cause serotonin syndrome. This condition can be harmful to your brain and nerve cells. This can be a life-threatening condition. What are the causes? This condition may be caused by taking medicines or drugs that increase the level of serotonin in your body, such as:  Antidepressant medicines.  Migraine medicines.  Certain pain medicines.  Certain drugs, including ecstasy, LSD, cocaine, and amphetamines.  Over-the-counter cough or cold medicines that contain dextromethorphan.  Certain herbal supplements, including St. John's wort, ginseng, and nutmeg. This condition usually occurs when you take these medicines or drugs in combination, but it can also happen with a high dose of a single medicine or drug. What increases the risk? You are more likely to develop this  condition if:  You just started taking a medicine or drug that increases the level of serotonin in the body.  You recently increased the dose of a medicine or drug that increases the level of serotonin in the body.  You take more than one medicine or drug that increases the level of serotonin in the body. What are the signs or symptoms? Symptoms of this condition usually start within several hours of taking a medicine or drug. Symptoms may be mild or severe. Mild symptoms include:  Sweating.  Restlessness or agitation.  Muscle twitching or stiffness.  Rapid heart rate.  Nausea and vomiting.  Diarrhea.  Headache.  Shivering or goose bumps.  Confusion. Severe symptoms include:  Irregular heartbeat.  Seizures.  Loss of consciousness.  High fever. How is this diagnosed? This condition may be diagnosed based on:  Your medical history.  A physical exam.  Your prior use of drugs and medicines.  Blood or urine tests. These may be used to rule out other causes of your symptoms. How is this treated? The treatment for this condition depends on the severity of your symptoms.  For mild cases, stopping the medicine or drug that caused your condition is usually all that is needed.  For moderate to severe cases, treatment in a hospital may be needed to prevent or manage life-threatening symptoms. This may include medicines to control your symptoms, IV fluids, interventions to support your breathing, and treatments to control your body temperature. Follow these instructions at home: Medicines  Take over-the-counter and prescription medicines only as told by your health care provider. This is important.  Check with your health care provider before you start taking any new prescriptions, over-the-counter medicines, herbs, or supplements.  Avoid combining any medicines that can cause this condition to occur.   Lifestyle  Maintain a healthy lifestyle. ? Eat a healthy diet  that includes plenty of vegetables, fruits, whole grains, low-fat dairy products, and lean protein. Do not eat a lot of foods that are high in fat, added sugars, or salt. ? Get the right amount and quality of sleep. Most adults need 7-9 hours of sleep each night. ? Make time to exercise, even if it is only for short periods of time. Most adults should exercise for at least 150 minutes each week. ? Do not drink alcohol. ? Do not use illegal drugs, and do not take medicines for reasons  other than they are prescribed.   General instructions  Do not use any products that contain nicotine or tobacco, such as cigarettes and e-cigarettes. If you need help quitting, ask your health care provider.  Keep all follow-up visits as told by your health care provider. This is important. Contact a health care provider if:  Your symptoms do not improve or they get worse. Get help right away if you:  Have worsening confusion, severe headache, chest pain, high fever, seizures, or loss of consciousness.  Experience serious side effects of medicine, such as swelling of your face, lips, tongue, or throat.  Have serious thoughts about hurting yourself or others. These symptoms may represent a serious problem that is an emergency. Do not wait to see if the symptoms will go away. Get medical help right away. Call your local emergency services (911 in the [place] Do not drive yourself to the hospital. If you ever feel like you may hurt yourself or others, or have thoughts about taking your own life, get help right away. You can go to your nearest emergency department or call:  Your local emergency services (911 in the [place]  A suicide crisis helpline, such as the [facility] at 937-314-0927. This is open 24 hours a day. Summary  Serotonin is a brain chemical that helps to regulate the nervous system. High levels of serotonin in the body can cause serotonin syndrome, which is a very  dangerous condition.  This condition may be caused by taking medicines or drugs that increase the level of serotonin in your body.  Treatment depends on the severity of your symptoms. For mild cases, stopping the medicine or drug that caused your condition is usually all that is needed.  Check with your health care provider before you start taking any new prescriptions, over-the-counter medicines, herbs, or supplements. This information is not intended to replace advice given to you by your health care provider. Make sure you discuss any questions you have with your health care provider. Document Revised: 02/20/2017 Document Reviewed: 02/20/2017 Elsevier Patient Education  2021 [employer].

## 2020-06-05 NOTE — Telephone Encounter (Signed)
D:  Dr. Shea Evans referred pt to [facility].  A:  Placed call to orient pt, but pt states she would like to verify her insurance benefits before committing.  Provided pt with case manager's name and phone number and encouraged pt to call once she calls her insurance company.  Inform Dr. Shea Evans.  R:  Patient receptive.

## 2020-06-05 NOTE — Progress Notes (Signed)
Virtual Visit via Video Note  I connected with Daisy [name] on 06/05/20 at  2:00 PM EDT by a video enabled telemedicine application and verified that I am speaking with the correct person using two identifiers.  Location Provider Location : [facility] Patient Location : Home  Participants: Patient , Provider    I discussed the limitations of evaluation and management by telemedicine and the availability of in person appointments. The patient expressed understanding and agreed to proceed.    I discussed the assessment and treatment plan with the patient. The patient was provided an opportunity to ask questions and all were answered. The patient agreed with the plan and demonstrated an understanding of the instructions.   The patient was advised to call back or seek an in-person evaluation if the symptoms worsen or if the condition fails to improve as anticipated.    [facility] MD OP Progress Note  06/05/2020 5:30 PM Daisy [name]  MRN:  [id number]  Chief Complaint:  Chief Complaint    Follow-up; Anxiety; Depression     HPI: Daisy [name] is a 40 year old female, currently employed, lives with her boyfriend has a history of depression, social anxiety disorder, hypertension, migraine headaches was evaluated by telemedicine today.  Patient's last appointment was on 03/07/2020.  Patient at that visit was advised to follow up in 4 weeks or sooner however did not follow through with recommendation.  Patient also was educated about noncompliance, clinic policy about noncompliance last visit.  Patient today returns reporting she is struggling with her mood.  She reports it was the anniversary of her mother's death, it was also Mother's Day beginning of May.  She reports that brought back a lot of memories and hence she has been struggling.    Patient reports she currently struggles with feeling sad, crying spells, concentration problems, memory problems, sleep problems, getting worse since the past  few months.  She reports she is often forgetful to the point that it is affecting productivity at work.  She reports she often has to sit down and think about what she learned or what she was doing the previous day to continue her work.  She reports this has been getting worse since the past few weeks.  Patient reports she struggles with sleep on a regular basis.  This may have been going on since the past 2 to 3 months.  She reports she works from 7:45 in the morning to 6 PM.  She goes to bed by 9 to 10  PM.  She however reports she lays awake until 12 AM.  She reports she watches TV during that time and has trouble falling asleep.  She does drink tea throughout the day.  She reports she drank tea last night which could also have contributed to her sleep problems.  However she is not sure.  She  Visit Diagnosis:    ICD-10-CM   1. MDD (major depressive disorder), recurrent episode, moderate (HCC)  F33.1 escitalopram (LEXAPRO) 10 MG tablet    traZODone (DESYREL) 50 MG tablet    DULoxetine (CYMBALTA) 20 MG capsule    DISCONTINUED: DULoxetine (CYMBALTA) 20 MG capsule  2. Social anxiety disorder  F40.10 escitalopram (LEXAPRO) 10 MG tablet    DULoxetine (CYMBALTA) 20 MG capsule    DISCONTINUED: DULoxetine (CYMBALTA) 20 MG capsule  3. Memory problem  R41.3 TSH    Vitamin B12    Past Psychiatric History: I have reviewed past psychiatric history from progress note on 11/26/2018.  Past trials of Prozac,  Lexapro, Wellbutrin, Elavil, Seroquel, Klonopin  Past Medical History:  Past Medical History:  Diagnosis Date  . Anxiety   . Depression   . Hypertension   . Migraine     Past Surgical History:  Procedure Laterality Date  . NO PAST SURGERIES      Family Psychiatric History: I have reviewed family psychiatric history from progress note on 11/26/2018  Family History:  Family History  Problem Relation Age of Onset  . Alcohol abuse Maternal Aunt   . Depression Maternal Aunt   . Drug abuse  Maternal Uncle   . Alcohol abuse Maternal Grandmother     Social History: Reviewed social history from progress note on 11/26/2018 Social History   Socioeconomic History  . Marital status: Single    Spouse name: Not on file  . Number of children: 1  . Years of education: Not on file  . Highest education level: Not on file  Occupational History  . Not on file  Tobacco Use  . Smoking status: Never Smoker  . Smokeless tobacco: Never Used  Substance and Sexual Activity  . Alcohol use: Yes    Comment: occasionally  . Drug use: No  . Sexual activity: Not on file  Other Topics Concern  . Not on file  Social History Narrative  . Not on file   Social Determinants of Health   Financial Resource Strain: Not on file  Food Insecurity: Not on file  Transportation Needs: Not on file  Physical Activity: Not on file  Stress: Not on file  Social Connections: Not on file    Allergies:  Allergies  Allergen Reactions  . Atenolol     Hair loss and skin eruption Hair loss and skin eruption Hair loss and skin eruption Hair loss and skin eruption Hair loss and skin eruption   . Cephalexin Diarrhea    Metabolic Disorder Labs: No results found for: HGBA1C, MPG No results found for: PROLACTIN Lab Results  Component Value Date   CHOL 189 12/02/2013   TRIG 57.0 12/02/2013   HDL 46.90 12/02/2013   CHOLHDL 4 12/02/2013   VLDL 11.4 12/02/2013   LDLCALC 131 (H) 12/02/2013   Lab Results  Component Value Date   TSH 1.34 12/02/2013    Therapeutic Level Labs: No results found for: LITHIUM No results found for: VALPROATE No components found for:  CBMZ  Current Medications: Current Outpatient Medications  Medication Sig Dispense Refill  . escitalopram (LEXAPRO) 10 MG tablet Take 1 tablet (10 mg total) by mouth daily. 7 tablet 0  . acetaminophen (TYLENOL) 325 MG tablet Take 650 mg by mouth every 6 (six) hours as needed for headache.    [name] acyclovir (ZOVIRAX) 400 MG tablet Take 400  mg by mouth 3 (three) times daily.    [name] amLODipine (NORVASC) 5 MG tablet     . azithromycin (ZITHROMAX) 250 MG tablet Take 250 mg by mouth as directed.    . [employer] STOOL SOFTENER 100 MG capsule Take by mouth daily.    . DULoxetine (CYMBALTA) 20 MG capsule Take 1 capsule (20 mg total) by mouth daily. 30 capsule 0  . fluconazole (DIFLUCAN) 100 MG tablet Take by mouth.    . fluticasone (FLONASE) 50 MCG/ACT nasal spray Place 1 spray into both nostrils daily.    . hydrOXYzine (ATARAX/VISTARIL) 25 MG tablet TAKE 1 TABLET 2 (TWO) TIMES DAILY AS NEEDED FOR ANXIETY OR ITCHING. FOR ITCHING , SEVERE ANXIETY 180 tablet 1  . Multiple Vitamins-Minerals (MULTIVITAMIN PO) Take  1 tablet by mouth daily.    . Naproxen Sodium (ALEVE) 220 MG CAPS Take 1 capsule by mouth as needed (headaches or cramping).    . norethindrone (MICRONOR) 0.35 MG tablet     . Olopatadine HCl 0.2 % SOLN Apply 1 drop to eye 2 (two) times daily.    [name] terbinafine (LAMISIL) 250 MG tablet     . traZODone (DESYREL) 50 MG tablet Take 1.5-2 tablets (75-100 mg total) by mouth at bedtime as needed for sleep. For sleep 60 tablet 1  . Vitamin D, Ergocalciferol, (DRISDOL) 1.25 MG (50000 UNIT) CAPS capsule Take 50,000 Units by mouth once a week.     No current facility-administered medications for this visit.     Musculoskeletal: Strength & Muscle Tone: UTA Gait & Station: UTA Patient leans: N/A  Psychiatric Specialty Exam: Review of Systems  Psychiatric/Behavioral: Positive for decreased concentration, dysphoric mood and sleep disturbance. The patient is nervous/anxious.   All other systems reviewed and are negative.   There were no vitals taken for this visit.There is no height or weight on file to calculate BMI.  General Appearance: Casual  Eye Contact:  Fair  Speech:  Clear and Coherent  Volume:  Normal  Mood:  Anxious and Depressed  Affect:  Congruent  Thought Process:  Goal Directed and Descriptions of Associations: Intact   Orientation:  Full (Time, Place, and Person)  Thought Content: Logical   Suicidal Thoughts:  No  Homicidal Thoughts:  No  Memory:  Immediate;   Fair Recent;   Fair Remote;   Fair does report memory changes  Judgement:  Fair  Insight:  Fair  Psychomotor Activity:  Normal  Concentration:  Concentration: Fair and Attention Span: Fair  Recall:  [employer] of Knowledge: Fair  Language: Fair  Akathisia:  No  Handed:  Right  AIMS (if indicated): UTA  Assets:  Communication Skills Desire for Improvement Housing Social Support Talents/Skills  ADL's:  Intact  Cognition: WNL  Sleep:  Poor   Screenings: GAD-7   Flowsheet Row Video Visit from 06/05/2020 in [facility]  Total GAD-7 Score 14    PHQ2-9   Flowsheet Row Video Visit from 06/05/2020 in [facility] Video Visit from 03/07/2020 in [facility]   PHQ-2 Total Score 5 4  PHQ-9 Total Score 21 15    Flowsheet Row Video Visit from 06/05/2020 in [facility] Video Visit from 03/07/2020 in [facility] Low Risk No Risk       Assessment and Plan: Daisy [name] is a 40 year old African-American female, employed, lives in [place], has a history of depression, anxiety, bereavement, hypertension, migraine headaches was evaluated by telemedicine today.  Patient with psychosocial stressors of recent death of her mother, being infected with COVID-19, relationship struggles, work-related stressors continues to struggle with depressive symptoms, anxiety and sleep problems.  She also struggles with memory problems and will benefit from the following plan.  Plan MDD-unstable Taper of Lexapro, advised patient to take Lexapro 10 mg p.o. daily for the next 1 week and stop taking it. Start Cymbalta 20 mg p.o. daily.  Will increase the dosage as needed. Increase trazodone to 75 mg - 100 mg  p.o. nightly as needed for sleep Discussed sleep hygiene techniques, discussed to switch off TV and other devices like laptop, phone at least a few hours prior to bedtime, keep the thermostat at a comfortable setting, where something comfortable to bed, relaxation  techniques like coloring, listening to soft music, meditation, reading a book, avoiding caffeinated drinks as well as increased fluid intake or alcohol at bedtime. She will benefit from psychotherapy sessions.  Advised patient to schedule an appointment with therapist.  Patient wants to contact her own therapist, her work has given her some resources. However in the meantime we will refer her for IOP, I have sent a referral to our coordinator Ms. Dellia Nims with [facility].  Social anxiety disorder-unstable She will benefit from CBT Start Cymbalta 20 mg p.o. daily  Memory problems-patient has been struggling with memory problems since the past several months.  Patient is interested in referral for neuropsychological testing. Will order labs-TSH, vitamin B12 Discussed with patient memory problems likely multifactorial given her worsening depression, anxiety symptoms and sleep problems.  However based on patient preference we will refer her to Dr. Darol Destine.  Noncompliance with treatment plan- discussed with patient to keep her scheduled appointments and follow recommendations.  Provided education.  Follow-up in clinic in 3 weeks or sooner if needed.  This note was generated in part or whole with voice recognition software. Voice recognition is usually quite accurate but there are transcription errors that can and very often do occur. I apologize for any typographical errors that were not detected and corrected.        Ursula Alert, MD 06/07/2020, 8:22 AM

## 2020-06-08 ENCOUNTER — Telehealth: Payer: Self-pay

## 2020-06-08 NOTE — Telephone Encounter (Signed)
faxed and confirmed labwork order sent to [facility] TSH,  Vitamin B12  dx: r41.3

## 2020-06-13 ENCOUNTER — Telehealth (HOSPITAL_COMMUNITY): Payer: Self-pay | Admitting: Psychiatry

## 2020-06-13 NOTE — Telephone Encounter (Signed)
D:  Pt called stating that she is ready to get started in [place].  A:  Scheduled CCA tomorrow at 10 a.m with pt, so she can start possibly this week or beginning of next.  R:  Pt receptive.

## 2020-06-14 ENCOUNTER — Other Ambulatory Visit: Payer: Self-pay

## 2020-06-14 ENCOUNTER — Other Ambulatory Visit (HOSPITAL_COMMUNITY): Payer: Medicaid Other | Attending: Psychiatry | Admitting: Psychiatry

## 2020-06-14 DIAGNOSIS — Z634 Disappearance and death of family member: Secondary | ICD-10-CM | POA: Insufficient documentation

## 2020-06-14 DIAGNOSIS — F331 Major depressive disorder, recurrent, moderate: Secondary | ICD-10-CM | POA: Insufficient documentation

## 2020-06-14 DIAGNOSIS — Z79899 Other long term (current) drug therapy: Secondary | ICD-10-CM | POA: Insufficient documentation
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
## 2020-06-19 ENCOUNTER — Encounter (HOSPITAL_COMMUNITY): Payer: Self-pay | Admitting: Psychiatry

## 2020-06-19 ENCOUNTER — Other Ambulatory Visit: Payer: Self-pay

## 2020-06-19 ENCOUNTER — Other Ambulatory Visit (HOSPITAL_COMMUNITY): Payer: Medicaid Other | Admitting: Psychiatry

## 2020-06-19 DIAGNOSIS — Z634 Disappearance and death of family member: Secondary | ICD-10-CM

## 2020-06-19 DIAGNOSIS — F331 Major depressive disorder, recurrent, moderate: Secondary | ICD-10-CM

## 2020-06-19 NOTE — Progress Notes (Signed)
Virtual Visit via Video Note  I connected with Daisy [name] on 06/20/20 at  9:00 AM EDT by a video enabled telemedicine application and verified that I am speaking with the correct person using two identifiers.  Location: Patient: Home Provider: Office   I discussed the limitations of evaluation and management by telemedicine and the availability of in person appointments. The patient expressed understanding and agreed to proceed.    I discussed the assessment and treatment plan with the patient. The patient was provided an opportunity to ask questions and all were answered. The patient agreed with the plan and demonstrated an understanding of the instructions.   The patient was advised to call back or seek an in-person evaluation if the symptoms worsen or if the condition fails to improve as anticipated.  I provided 15 minutes of non-face-to-face time during this encounter.   Derrill Center, NP    Psychiatric Initial Adult Assessment   Patient Identification: Daisy [name] MRN:  [id number] Date of Evaluation:  06/20/2020 Referral Source: Psychiatrist [place]  Chief Complaint:   Chief Complaint    Depression; Anxiety; Stress     Visit Diagnosis:    ICD-10-CM   1. MDD (major depressive disorder), recurrent episode, moderate (HCC)  F33.1   2. Bereavement  Z63.4     History of Present Illness:  Daisy [name] was referred to Intensive Outpatient  program by her primary psychiatrist MD Eappen due to worsening depression related to grief and loss.  Reports a history of major depressive disorder and anxiety.  [name] feeling down and depressed due to the passing of her mother reports multiple relatives that have passed away within a short amount of time.  States she continues to have difficulty coping, mood irritability, poor concentration and crying spells.  Patient presents flat, guarded but pleasant.    Daisy [name] is currently she is denying suicidal or homicidal ideations.   Denies auditory or visual hallucinations.  Reports she is prescribed Cymbalta with a recent titration from Lexapro.  Patient reports she has not been taking medications as indicated. "  Has been experiencing brain fog" unsure if this is related to medication.  States she has been followed by therapy and psychiatry for the past 3 to 4 years.  Denied previous inpatient admissions.  Denied history of physical sexual abuse. [name]  family history with mental illness.  Mother: depression and anxiety, maternal Aunt: depression and substance use. Daisy [name] a decreased appetite and stated she is not resting well throughout the night. Patient to start Intensive Outpatient programming on 06/19/2020.   Associated Signs/Symptoms: Depression Symptoms:  depressed mood, feelings of worthlessness/guilt, difficulty concentrating, loss of energy/fatigue, disturbed sleep, (Hypo) Manic Symptoms:  Distractibility, Irritable Mood, Anxiety Symptoms:  Excessive Worry, Psychotic Symptoms:  Hallucinations: None PTSD Symptoms: Had a traumatic exposure:  grief and loss  Past Psychiatric History: Major Depression Disorder  Previous Psychotropic Medications: Yes   Substance Abuse History in the last 12 months:  No.  Consequences of Substance Abuse: NA  Past Medical History:  Past Medical History:  Diagnosis Date  . Anxiety   . Depression   . Hypertension   . Migraine     Past Surgical History:  Procedure Laterality Date  . NO PAST SURGERIES      Family Psychiatric History:   Family History:  Family History  Problem Relation Age of Onset  . Alcohol abuse Maternal Aunt   . Depression Maternal Aunt   . Drug abuse Maternal Uncle   . Alcohol  abuse Maternal Grandmother     Social History:   Social History   Socioeconomic History  . Marital status: Single    Spouse name: Not on file  . Number of children: 1  . Years of education: Not on file  . Highest education level: Not on file   Occupational History  . Not on file  Tobacco Use  . Smoking status: Never Smoker  . Smokeless tobacco: Never Used  Substance and Sexual Activity  . Alcohol use: Yes    Comment: occasionally  . Drug use: No  . Sexual activity: Not on file  Other Topics Concern  . Not on file  Social History Narrative  . Not on file   Social Determinants of Health   Financial Resource Strain: Not on file  Food Insecurity: Not on file  Transportation Needs: Not on file  Physical Activity: Not on file  Stress: Not on file  Social Connections: Not on file    Additional Social History:  Allergies:   Allergies  Allergen Reactions  . Atenolol     Hair loss and skin eruption Hair loss and skin eruption Hair loss and skin eruption Hair loss and skin eruption Hair loss and skin eruption   . Cephalexin Diarrhea    Metabolic Disorder Labs: No results found for: HGBA1C, MPG No results found for: PROLACTIN Lab Results  Component Value Date   CHOL 189 12/02/2013   TRIG 57.0 12/02/2013   HDL 46.90 12/02/2013   CHOLHDL 4 12/02/2013   VLDL 11.4 12/02/2013   LDLCALC 131 (H) 12/02/2013   Lab Results  Component Value Date   TSH 1.34 12/02/2013    Therapeutic Level Labs: No results found for: LITHIUM No results found for: CBMZ No results found for: VALPROATE  Current Medications: Current Outpatient Medications  Medication Sig Dispense Refill  . acetaminophen (TYLENOL) 325 MG tablet Take 650 mg by mouth every 6 (six) hours as needed for headache.    [name] acyclovir (ZOVIRAX) 400 MG tablet Take 400 mg by mouth 3 (three) times daily.    [name] amLODipine (NORVASC) 5 MG tablet     . azithromycin (ZITHROMAX) 250 MG tablet Take 250 mg by mouth as directed.    . [employer] STOOL SOFTENER 100 MG capsule Take by mouth daily.    . DULoxetine (CYMBALTA) 20 MG capsule Take 1 capsule (20 mg total) by mouth daily. 30 capsule 0  . escitalopram (LEXAPRO) 10 MG tablet Take 1 tablet (10 mg total) by mouth daily. 7  tablet 0  . fluconazole (DIFLUCAN) 100 MG tablet Take by mouth.    . fluticasone (FLONASE) 50 MCG/ACT nasal spray Place 1 spray into both nostrils daily.    . hydrOXYzine (ATARAX/VISTARIL) 25 MG tablet TAKE 1 TABLET 2 (TWO) TIMES DAILY AS NEEDED FOR ANXIETY OR ITCHING. FOR ITCHING , SEVERE ANXIETY 180 tablet 1  . Multiple Vitamins-Minerals (MULTIVITAMIN PO) Take 1 tablet by mouth daily.    . Naproxen Sodium 220 MG CAPS Take 1 capsule by mouth as needed (headaches or cramping).    . norethindrone (MICRONOR) 0.35 MG tablet     . Olopatadine HCl 0.2 % SOLN Apply 1 drop to eye 2 (two) times daily.    [name] terbinafine (LAMISIL) 250 MG tablet     . traZODone (DESYREL) 50 MG tablet Take 1.5-2 tablets (75-100 mg total) by mouth at bedtime as needed for sleep. For sleep 60 tablet 1  . Vitamin D, Ergocalciferol, (DRISDOL) 1.25 MG (50000 UNIT) CAPS capsule Take  50,000 Units by mouth once a week.     No current facility-administered medications for this visit.    Musculoskeletal: Strength & Muscle Tone: within normal limits Gait & Station: normal Patient leans: N/A  Psychiatric Specialty Exam: Review of Systems  There were no vitals taken for this visit.There is no height or weight on file to calculate BMI.  General Appearance: Casual  Eye Contact:  Good  Speech:  Clear and Coherent  Volume:  Normal  Mood:  Anxious and Depressed  Affect:  Congruent  Thought Process:  Coherent  Orientation:  Full (Time, Place, and Person)  Thought Content:  Logical  Suicidal Thoughts:  No  Homicidal Thoughts:  No  Memory:  Immediate;   Good  Judgement:  Good  Insight:  Good  Psychomotor Activity:  Normal  Concentration:  Concentration: Fair  Recall:  [place] of Knowledge:Fair  Language: Fair  Akathisia:  No  Handed:  Right  AIMS (if indicated):  Assets:  Communication Skills Desire for Improvement Resilience Social Support  ADL's:  Intact  Cognition: WNL  Sleep:  Fair   Screenings: GAD-7    Flowsheet Row Video Visit from 06/05/2020 in [place]  Total GAD-7 Score 14    PHQ2-9   Flowsheet Row Counselor from 06/14/2020 in [place] Video Visit from 06/05/2020 in [place] Video Visit from 03/07/2020 in [place]  PHQ-2 Total Score 6 5 4   PHQ-9 Total Score 26 21 15     Flowsheet Row Counselor from 06/19/2020 in [place] from 06/14/2020 in [place] Video Visit from 06/05/2020 in [place] Low Risk Error: Question 6 not populated Low Risk      Assessment and Plan:  Start Intensive Outpatient Programming Continue medications as directed   - patient appears to need encouragement for medication management  Treatment plan was reviewed and agreed upon by NP T.Deretha Ertle and patient Daisy [name] need for group services.   Derrill Center, NP 5/25/202212:25 PM

## 2020-06-20 ENCOUNTER — Other Ambulatory Visit: Payer: Self-pay

## 2020-06-20 ENCOUNTER — Other Ambulatory Visit (HOSPITAL_COMMUNITY): Payer: Medicaid Other | Admitting: Psychiatry

## 2020-06-20 DIAGNOSIS — Z79899 Other long term (current) drug therapy: Secondary | ICD-10-CM | POA: Diagnosis not present

## 2020-06-20 DIAGNOSIS — F331 Major depressive disorder, recurrent, moderate: Secondary | ICD-10-CM | POA: Diagnosis present

## 2020-06-20 DIAGNOSIS — Z634 Disappearance and death of family member: Secondary | ICD-10-CM | POA: Diagnosis not present

## 2020-06-21 ENCOUNTER — Encounter (HOSPITAL_COMMUNITY): Payer: Self-pay | Admitting: Psychiatry

## 2020-06-21 ENCOUNTER — Other Ambulatory Visit: Payer: Self-pay

## 2020-06-21 ENCOUNTER — Other Ambulatory Visit (HOSPITAL_COMMUNITY): Payer: Medicaid Other | Admitting: Psychiatry

## 2020-06-21 DIAGNOSIS — F331 Major depressive disorder, recurrent, moderate: Secondary | ICD-10-CM | POA: Diagnosis not present

## 2020-06-21 NOTE — Progress Notes (Signed)
Virtual Visit via Video Note  I connected with [name] on 06/21/20 at  9:00 AM EDT by a video enabled telemedicine application and verified that I am speaking with the correct person using two identifiers.  At orientation to the IOP program, Case Manager discussed the limitations of evaluation and management by telemedicine and the availability of in person appointments. The patient expressed understanding and agreed to proceed with virtual visits throughout the duration of the program.   Location:  Patient: Patient Home Provider: Home Office   History of Present Illness: MDD   Observations/Objective: Check In: Case Manager checked in with all participants to review discharge dates, insurance authorizations, work-related documents and needs from the treatment team regarding medications. Client stated needs and engaged in discussion. Case Manager introduced new Client to the group, with group members welcoming and starting the joining process.   Initial Therapeutic Activity: Counselor introduced [employer] Chaplain to present information and discussion on Grief and Loss. Group members engaged in discussion, sharing how grief impacts them, what comforts them, what emotions are felt, labeling losses, etc. After guest speaker logged off, Counselor prompted group to spend 10-15 minutes journaling to process personal grief and loss situations. Counselor processed entries with group and client's identified areas for additional processing in individual therapy. Client noted grief related to mother passing and stage of life maturational issues.   Second Therapeutic Activity: Counselor facilitated a check-in with group members to assess mood and current functioning. Client shared details of their mental health management since our last session, including challenges and successes. Counselor engaged group in discussion, covering the following topics: discharge planning, [employer] [place],  art as an emotional outlet, Disability Rights of [place], collective/corporate grieving and assertive communication. Client presents with moderate depression and moderate anxiety. Client denied any current SI/HI/psychosis.  Check Out: Counselor prompted group members to share what self-care practice or productivity activity they will engage in today. Group members shared their plans with the group. Client endorsed safety plan to be followed to prevent safety issues.   Assessment and Plan: Clinician recommends that Client remain in IOP treatment to better manage mental health symptoms, stabilization and to address treatment plan goals. Clinician recommends adherence to crisis/safety plan, taking medications as prescribed, and following up with medical professionals if any issues arise.    Follow Up Instructions: Clinician will send Webex link for next session. The Client was advised to call back or seek an in-person evaluation if the symptoms worsen or if the condition fails to improve as anticipated.     I provided 180 minutes of non-face-to-face time during this encounter.     Lise Auer, LCSW

## 2020-06-21 NOTE — Progress Notes (Signed)
Virtual Visit via Video Note  I connected with Daisy [name] on 06/20/20 at  9:00 AM EDT by a video enabled telemedicine application and verified that I am speaking with the correct person using two identifiers.  At orientation to the IOP program, Case Manager discussed the limitations of evaluation and management by telemedicine and the availability of in person appointments. The patient expressed understanding and agreed to proceed with virtual visits throughout the duration of the program.   Location:  Patient: Patient Home Provider: Home Office   History of Present Illness: MDD  Observations/Objective: Check In: Case Manager checked in with all participants to review discharge dates, insurance authorizations, work-related documents and needs from the treatment team regarding medications. Client stated needs and engaged in discussion. Case Manager introduced new Client to the group, with group members welcoming and starting the joining process.   Initial Therapeutic Activity: Counselor facilitated a check-in with group members to assess mood and current functioning. Client shared details of their mental health management since our last session, including challenges and successes. Counselor engaged group in discussion, covering the following topics: relational issues, sex drive/sexual health issues and trauma, . Client presents with moderate depression and moderate anxiety. Client denied any current SI/HI/psychosis.   Second Therapeutic Activity: Counselor continued psychoeducation on boundaries with the group sharing a handout on personal boundaries and the boundary types. Counselor prompted group members to share where they are rigid, porous and health in their boundaries. Group engaged in discussion, sharing specific examples. Counselor shared additional information on boundary areas, such as emotional, material, sexual, intellectual, time and physical. Counselor shared resources on how to  use additional worksheets to further improve their boundary application for homework.   Check Out: Counselor closed program by allowing time to celebrate a graduating group member. Counselor shared reflections on progress and allow space for group members to share well wishes and appreciates to the graduating client. Counselor prompted graduating client to share takeaways, reflect on progress and final thoughts for the group. Counselor prompted group members to share what self-care practice or productivity activity they will engage in today. Group members shared their plans with the group. Client endorsed safety plan to be followed to prevent safety issues.   Assessment and Plan: Clinician recommends that Client remain in IOP treatment to better manage mental health symptoms, stabilization and to address treatment plan goals. Clinician recommends adherence to crisis/safety plan, taking medications as prescribed, and following up with medical professionals if any issues arise.    Follow Up Instructions: Clinician will send Webex link for next session. The Client was advised to call back or seek an in-person evaluation if the symptoms worsen or if the condition fails to improve as anticipated.     I provided 180 minutes of non-face-to-face time during this encounter.     Lise Auer, LCSW

## 2020-06-22 ENCOUNTER — Other Ambulatory Visit: Payer: Self-pay

## 2020-06-22 ENCOUNTER — Other Ambulatory Visit (HOSPITAL_COMMUNITY): Payer: Medicaid Other | Admitting: Psychiatry

## 2020-06-22 DIAGNOSIS — F331 Major depressive disorder, recurrent, moderate: Secondary | ICD-10-CM | POA: Diagnosis not present

## 2020-06-26 ENCOUNTER — Other Ambulatory Visit: Payer: Self-pay

## 2020-06-26 ENCOUNTER — Other Ambulatory Visit (HOSPITAL_COMMUNITY): Payer: Medicaid Other | Admitting: Licensed Clinical Social Worker

## 2020-06-26 DIAGNOSIS — F331 Major depressive disorder, recurrent, moderate: Secondary | ICD-10-CM | POA: Diagnosis not present

## 2020-06-26 DIAGNOSIS — F333 Major depressive disorder, recurrent, severe with psychotic symptoms: Secondary | ICD-10-CM

## 2020-06-27 ENCOUNTER — Telehealth: Payer: No Typology Code available for payment source | Admitting: Psychiatry

## 2020-06-27 ENCOUNTER — Other Ambulatory Visit (HOSPITAL_COMMUNITY): Payer: Medicaid Other | Attending: Psychiatry | Admitting: Psychiatry

## 2020-06-27 ENCOUNTER — Other Ambulatory Visit: Payer: Self-pay

## 2020-06-27 DIAGNOSIS — F331 Major depressive disorder, recurrent, moderate: Secondary | ICD-10-CM | POA: Insufficient documentation

## 2020-06-28 ENCOUNTER — Encounter (HOSPITAL_COMMUNITY): Payer: Self-pay

## 2020-06-28 ENCOUNTER — Encounter (HOSPITAL_COMMUNITY): Payer: Self-pay | Admitting: Psychiatry

## 2020-06-28 ENCOUNTER — Other Ambulatory Visit (HOSPITAL_COMMUNITY): Payer: Medicaid Other | Admitting: Psychiatry

## 2020-06-28 DIAGNOSIS — F331 Major depressive disorder, recurrent, moderate: Secondary | ICD-10-CM | POA: Diagnosis not present

## 2020-06-28 NOTE — Progress Notes (Signed)
Virtual Visit via Video Note  I connected with Daisy [name] on 06/28/20 at  9:00 AM EDT by a video enabled telemedicine application and verified that I am speaking with the correct person using two identifiers.  At orientation to the IOP program, Case Manager discussed the limitations of evaluation and management by telemedicine and the availability of in person appointments. The patient expressed understanding and agreed to proceed with virtual visits throughout the duration of the program.   Location:  Patient: Patient Home Provider: Home Office   History of Present Illness: MDD  Observations/Objective: Check In: Case Manager checked in with all participants to review discharge dates, insurance authorizations, work-related documents and needs from the treatment team regarding medications. Client stated needs and engaged in discussion.   Initial Therapeutic Activity: Counselor facilitated a check-in with group members to assess mood and current functioning. Client shared details of their mental health management since our last session, including challenges and successes. Counselor engaged group in discussion, covering the following topics: communication skills, boundary setting, peaceful outlets and thought stopping. Client denied any current SI/HI/psychosis.  Second Therapeutic Activity: Counselor presented information and discussed Grief and Loss as it relates to the group members experience. Group members engaged in discussion, sharing how grief impacts them, what comforts them, what emotions are felt, labeling losses, etc. Counselor prompted group to spend 10-15 minutes journaling to process personal grief and loss situations. Counselor processed entries with group and client's identified areas for additional processing in individual therapy. Client noted loss related to her mother and other family members death, and new roles she has taken on.   Check Out: Counselor prompted group  members to share what self-care practice or productivity activity they will engage in today. Group members shared their plans with the group. Client endorsed safety plan to be followed to prevent safety issues.   Assessment and Plan: Clinician recommends that Client remain in IOP treatment to better manage mental health symptoms, stabilization and to address treatment plan goals. Clinician recommends adherence to crisis/safety plan, taking medications as prescribed, and following up with medical professionals if any issues arise.    Follow Up Instructions: Clinician will send Webex link for next session. The Client was advised to call back or seek an in-person evaluation if the symptoms worsen or if the condition fails to improve as anticipated.     I provided 180 minutes of non-face-to-face time during this encounter.     Lise Auer, LCSW

## 2020-06-28 NOTE — Progress Notes (Signed)
Virtual Visit via Video Note  I connected with [name] on 06/22/20 at  9:00 AM EDT by a video enabled telemedicine application and verified that I am speaking with the correct person using two identifiers.  At orientation to the IOP program, Case Manager discussed the limitations of evaluation and management by telemedicine and the availability of in person appointments. The patient expressed understanding and agreed to proceed with virtual visits throughout the duration of the program.   Location:  Patient: Patient Home Provider: Home Office   History of Present Illness: MDD  Observations/Objective: Check In: Case Manager checked in with all participants to review discharge dates, insurance authorizations, work-related documents and needs from the treatment team regarding medications. Daisy [name] stated needs and engaged in discussion.   Initial Therapeutic Activity: Counselor facilitated a check-in with group members to assess mood and current functioning. Daisy [name] shared details of their mental health management since our last session, including challenges and successes. Counselor engaged group in discussion, covering the following topics: family conflict, boundary setting, applying coping skills, grief and loss and assertive communication. Daisy [name] presents with moderate depression and moderate anxiety. Daisy [name] denied any current SI/HI/psychosis.  Second Therapeutic Activity: Counselor introduced [employer], representative with [employer] to share about programming. Group Members asked questions and engaged in discussion, as Cristie Hem shared about Peer Support, Support Groups and the [employer]. Daisy [name] stated that they are interested in connecting with the Support Groups.  Check Out: Counselor closed program by allowing time to celebrate a graduating group member. Counselor shared reflections on progress and allow space for group members to share well wishes and appreciates to the  graduating Daisy [name]. Counselor prompted graduating Daisy [name] to share takeaways, reflect on progress and final thoughts for the group. Counselor prompted group members to share what self-care practice or productivity activity they will engage in over the weekend. Group members shared their plans with the group. Daisy [name] endorsed safety plan to be followed to prevent safety issues.   Assessment and Plan: Clinician recommends that Daisy [name] remain in IOP treatment to better manage mental health symptoms, stabilization and to address treatment plan goals. Clinician recommends adherence to crisis/safety plan, taking medications as prescribed, and following up with medical professionals if any issues arise.    Follow Up Instructions: Clinician will send Webex link for next session. The Daisy [name] was advised to call back or seek an in-person evaluation if the symptoms worsen or if the condition fails to improve as anticipated.     I provided 180 minutes of non-face-to-face time during this encounter.     Lise Auer, LCSW

## 2020-06-28 NOTE — Progress Notes (Signed)
Virtual Visit via Video Note  I connected with [name] on 06/27/20 at  9:00 AM EDT by a video enabled telemedicine application and verified that I am speaking with the correct person using two identifiers.  At orientation to the IOP program, Case Manager discussed the limitations of evaluation and management by telemedicine and the availability of in person appointments. The patient expressed understanding and agreed to proceed with virtual visits throughout the duration of the program.   Location:  Patient: Patient Home Provider: Home Office   History of Present Illness: MDD  Observations/Objective: Check In: Case Manager checked in with all participants to review discharge dates, insurance authorizations, work-related documents and needs from the treatment team regarding medications. Client stated needs and engaged in discussion.   Initial Therapeutic Activity: Counselor facilitated a check-in with group members to assess mood and current functioning. Client shared details of their mental health management since our last session, including challenges and successes. Counselor engaged group in discussion, covering the following topics: welcoming 2 new group members, with current client's sharing about progress in treatment, treatment goals, support system and need for treatment. Client presents with moderate depression and moderate anxiety. Client denied any current SI/HI/psychosis.  Second Therapeutic Activity: Counselor introduced our guest speaker, Einar Grad, [employer] Pharmacist, who shared about psychiatric medications, side effects, treatment considerations and how to communicate with medical professionals. Group Members asked questions and shared medication concerns. Counselor prompted group members to reference a worksheet called, "Body Scan" to jot down questions and concerns about their physical health in preparation for their upcoming appointments with medical professionals. Client  noted decreased sex drive, weight issues, and seasonal allergies. Counselor encouraged routine medical check-ups, preparing for appointments, following up with recommendations and seeking specialist, if needed.  Check Out: Counselor prompted group members to share what self-care practice or productivity activity they will engage in today. Group members shared their plans with the group. Client endorsed safety plan to be followed to prevent safety issues.   Assessment and Plan: Clinician recommends that Client remain in IOP treatment to better manage mental health symptoms, stabilization and to address treatment plan goals. Clinician recommends adherence to crisis/safety plan, taking medications as prescribed, and following up with medical professionals if any issues arise.    Follow Up Instructions: Clinician will send Webex link for next session. The Client was advised to call back or seek an in-person evaluation if the symptoms worsen or if the condition fails to improve as anticipated.     I provided 180 minutes of non-face-to-face time during this encounter.     Lise Auer, LCSW

## 2020-06-29 ENCOUNTER — Other Ambulatory Visit: Payer: Self-pay

## 2020-06-29 ENCOUNTER — Encounter (HOSPITAL_COMMUNITY): Payer: Self-pay | Admitting: Psychiatry

## 2020-06-29 ENCOUNTER — Other Ambulatory Visit (HOSPITAL_COMMUNITY): Payer: Medicaid Other | Admitting: Psychiatry

## 2020-06-29 ENCOUNTER — Other Ambulatory Visit: Payer: Self-pay | Admitting: Psychiatry

## 2020-06-29 DIAGNOSIS — F331 Major depressive disorder, recurrent, moderate: Secondary | ICD-10-CM

## 2020-06-29 NOTE — Progress Notes (Signed)
Virtual Visit via Video Note  I connected with Daisy [name] on 06/29/20 at  9:00 AM EDT by a video enabled telemedicine application and verified that I am speaking with the correct person using two identifiers.  At orientation to the IOP program, Case Manager discussed the limitations of evaluation and management by telemedicine and the availability of in person appointments. The patient expressed understanding and agreed to proceed with virtual visits throughout the duration of the program.   Location:  Patient: Patient Home Provider: Home Office   History of Present Illness: MDD  Observations/Objective: Check In: Case Manager checked in with all participants to review discharge dates, insurance authorizations, work-related documents and needs from the treatment team regarding medications. Client stated needs and engaged in discussion.   Initial Therapeutic Activity: Counselor facilitated a check-in with group members to assess mood and current functioning. Client shared details of their mental health management since our last session, including challenges and successes. Counselor engaged group in discussion, covering the following topics: legal processes, community safety, managing MH symptoms with wellness practices, dietary considerations, and boundary setting. Client presents with moderate depression and moderate anxiety. Client denied any current SI/HI/psychosis.  Second Therapeutic Activity: Counselor introduced group to a resource that provides scripts for guided imagery. Counselor chose a healing relaxation script to read aloud to the group. The relaxation practice incorporated deep breathing, progressive muscle relaxation, and imagery. Counselor allowed group members to share about their experience with the practice. Client stated that she felt deeply relaxed and enjoyed the opportunity to connect with her body in that way. Client would like to add more practices to her self-care  routine.   Check Out: Counselor prompted group members to share what self-care practice or productivity activity they will engage in over the weekend. Group members shared their plans with the group. Client endorsed safety plan to be followed to prevent safety issues.   Assessment and Plan: Clinician recommends that Client remain in IOP treatment to better manage mental health symptoms, stabilization and to address treatment plan goals. Clinician recommends adherence to crisis/safety plan, taking medications as prescribed, and following up with medical professionals if any issues arise.    Follow Up Instructions: Clinician will send Webex link for next session. The Client was advised to call back or seek an in-person evaluation if the symptoms worsen or if the condition fails to improve as anticipated.     I provided 180 minutes of non-face-to-face time during this encounter.     Lise Auer, LCSW

## 2020-07-02 ENCOUNTER — Other Ambulatory Visit (HOSPITAL_COMMUNITY): Payer: Medicaid Other | Admitting: Psychiatry

## 2020-07-02 ENCOUNTER — Encounter (HOSPITAL_COMMUNITY): Payer: Self-pay

## 2020-07-02 ENCOUNTER — Other Ambulatory Visit: Payer: Self-pay

## 2020-07-02 DIAGNOSIS — F331 Major depressive disorder, recurrent, moderate: Secondary | ICD-10-CM | POA: Diagnosis not present

## 2020-07-02 NOTE — Progress Notes (Signed)
Virtual Visit via Video Note  I connected with Daisy [name] on 07/02/20 at  9:00 AM EDT by a video enabled telemedicine application and verified that I am speaking with the correct person using two identifiers.  At orientation to the IOP program, Case Manager discussed the limitations of evaluation and management by telemedicine and the availability of in person appointments. The patient expressed understanding and agreed to proceed with virtual visits throughout the duration of the program.   Location:  Patient: Patient Home Provider: Home Office   History of Present Illness: MDD  Observations/Objective: Check In: Case Manager checked in with all participants to review discharge dates, insurance authorizations, work-related documents and needs from the treatment team regarding medications. Client stated needs and engaged in discussion.   Initial Therapeutic Activity: Counselor facilitated a check-in with group members to assess mood and current functioning. Client shared details of their mental health management since our last session, including challenges and successes. Counselor engaged group in discussion, covering the following topics: warning signs of self-harm/SI, generational differences, coping with mental health stigma, behavioral train analysis, being empathetic, DBT and setting boundaries. Client presents with moderate depression and moderate anxiety. Client denied any current SI/HI/psychosis.  Second Therapeutic Activity: Group collectively decided that another progressive muscle relaxation and guided imagery would help reduce tension and anxiety within the group today. Counselor played an audio for group to follow prompts. Group members shared that it was relaxing, soothing and helpful in calming their anxiety symptoms. Group members shared about additional local and online resources to access similar exercises.   Check Out: Counselor prompted group members to share what  self-care practice or productivity activity they will engage in today. Client plans to go for a walk outside. Group members shared their plans with the group. Client endorsed safety plan to be followed to prevent safety issues.   Assessment and Plan: Clinician recommends that Client remain in IOP treatment to better manage mental health symptoms, stabilization and to address treatment plan goals. Clinician recommends adherence to crisis/safety plan, taking medications as prescribed, and following up with medical professionals if any issues arise.    Follow Up Instructions: Clinician will send Webex link for next session. The Client was advised to call back or seek an in-person evaluation if the symptoms worsen or if the condition fails to improve as anticipated.     I provided 180 minutes of non-face-to-face time during this encounter.     Lise Auer, LCSW

## 2020-07-03 ENCOUNTER — Ambulatory Visit (HOSPITAL_COMMUNITY): Payer: No Typology Code available for payment source | Admitting: Psychiatry

## 2020-07-03 ENCOUNTER — Telehealth (HOSPITAL_COMMUNITY): Payer: Self-pay | Admitting: Psychiatry

## 2020-07-04 ENCOUNTER — Other Ambulatory Visit (HOSPITAL_COMMUNITY): Payer: Medicaid Other | Admitting: Psychiatry

## 2020-07-04 ENCOUNTER — Encounter (HOSPITAL_COMMUNITY): Payer: Self-pay

## 2020-07-04 ENCOUNTER — Other Ambulatory Visit: Payer: Self-pay

## 2020-07-04 DIAGNOSIS — F331 Major depressive disorder, recurrent, moderate: Secondary | ICD-10-CM

## 2020-07-04 NOTE — Progress Notes (Signed)
Virtual Visit via Video Note  I connected with Daisy [name] on 07/04/20 at  9:00 AM EDT by a video enabled telemedicine application and verified that I am speaking with the correct person using two identifiers.  At orientation to the IOP program, Case Manager discussed the limitations of evaluation and management by telemedicine and the availability of in person appointments. The patient expressed understanding and agreed to proceed with virtual visits throughout the duration of the program.   Location:  Patient: Patient Home Provider: Home Office   History of Present Illness: MDD  Observations/Objective: Check In: Case Manager checked in with all participants to review discharge dates, insurance authorizations, work-related documents and needs from the treatment team regarding medications. Client stated needs and engaged in discussion.   Initial Therapeutic Activity: Counselor facilitated a check-in with group members to assess mood and current functioning. Client shared details of their mental health management since our last session, including challenges and successes. Counselor engaged group in discussion, covering the following topics: utilizations of safety plans, action plans, reconnecting with support group, grief and loss and positive self-talk. Client presents with moderate depression and moderate anxiety. Client denied any current SI/HI/psychosis.  Second Therapeutic Activity: Counselor reviewed information on the cycle of anxiety and cycle of depression to the group, highlighting that avoidance exacerbates the issues/symptoms. Counselor prompted group to create 2 lists, one how anxiety presents for them, and likewise for depression. Counselor then had group members review lists to note similarities and differences to promote awareness and understanding of warning signs. Group members then shared their observations and reflections with the group. Client engaged well and participated  appropriately in activity with thoughtful responses. Counselor finally challenged group to identify action steps in alleviating symptoms and combat avoidance. Group members shared their plans   Check Out: Counselor prompted group members to share what self-care practice or productivity activity they will engage in today. Group members shared their plans with the group. Client endorsed safety plan to be followed to prevent safety issues.   Assessment and Plan: Clinician recommends that Client remain in IOP treatment to better manage mental health symptoms, stabilization and to address treatment plan goals. Clinician recommends adherence to crisis/safety plan, taking medications as prescribed, and following up with medical professionals if any issues arise.    Follow Up Instructions: Clinician will send Webex link for next session. The Client was advised to call back or seek an in-person evaluation if the symptoms worsen or if the condition fails to improve as anticipated.     I provided 180 minutes of non-face-to-face time during this encounter.     Lise Auer, LCSW

## 2020-07-05 ENCOUNTER — Other Ambulatory Visit: Payer: Self-pay

## 2020-07-05 ENCOUNTER — Other Ambulatory Visit (HOSPITAL_COMMUNITY): Payer: Medicaid Other | Admitting: Psychiatry

## 2020-07-05 DIAGNOSIS — F331 Major depressive disorder, recurrent, moderate: Secondary | ICD-10-CM | POA: Diagnosis not present

## 2020-07-06 ENCOUNTER — Other Ambulatory Visit (HOSPITAL_COMMUNITY): Payer: Medicaid Other | Admitting: Psychiatry

## 2020-07-06 ENCOUNTER — Encounter (HOSPITAL_COMMUNITY): Payer: Self-pay

## 2020-07-06 ENCOUNTER — Other Ambulatory Visit: Payer: Self-pay

## 2020-07-06 DIAGNOSIS — F331 Major depressive disorder, recurrent, moderate: Secondary | ICD-10-CM

## 2020-07-06 NOTE — Progress Notes (Signed)
Virtual Visit via Video Note  I connected with Daisy [name] on 07/05/20 at  9:00 AM EDT by a video enabled telemedicine application and verified that I am speaking with the correct person using two identifiers.  At orientation to the IOP program, Case Manager discussed the limitations of evaluation and management by telemedicine and the availability of in person appointments. The patient expressed understanding and agreed to proceed with virtual visits throughout the duration of the program.   Location:  Patient: Patient Home Provider: Home Office   History of Present Illness: MDD  Observations/Objective: Check In: Case Manager checked in with all participants to review discharge dates, insurance authorizations, work-related documents and needs from the treatment team regarding medications. Client stated needs and engaged in discussion.   Initial Therapeutic Activity: Counselor facilitated a check-in with group members to assess mood and current functioning. Client shared details of their mental health management since our last session, including challenges and successes. Counselor engaged group in discussion, covering the following topics: self-care practices, initiating new relationships, gradual exposure, support groups, and therapy animals. Client presents with moderate depression and moderate anxiety. Client denied any current SI/HI/psychosis.  Second Therapeutic Activity: Counselor introduced [place], [place] Chaplain to present information and discussion on Grief and Loss. Group members engaged in discussion, sharing how grief impacts them, what comforts them, what emotions are felt, labeling losses, etc. After guest speaker logged off, Counselor prompted group to spend 10-15 minutes journaling to process personal grief and loss situations. Counselor processed entries with group and client's identified areas for additional processing in individual therapy. Client noted grief and loss  related to loss of family members.   Check Out: Counselor prompted group members to share what self-care practice or productivity activity they will engage in today. Group members shared their plans with the group. Client endorsed safety plan to be followed to prevent safety issues.   Assessment and Plan: Clinician recommends that Client remain in IOP treatment to better manage mental health symptoms, stabilization and to address treatment plan goals. Clinician recommends adherence to crisis/safety plan, taking medications as prescribed, and following up with medical professionals if any issues arise.    Follow Up Instructions: Clinician will send Webex link for next session. The Client was advised to call back or seek an in-person evaluation if the symptoms worsen or if the condition fails to improve as anticipated.     I provided 180 minutes of non-face-to-face time during this encounter.     Lise Auer, LCSW

## 2020-07-06 NOTE — Progress Notes (Signed)
Virtual Visit via Video Note  I connected with [name] on 07/06/20 at  9:00 AM EDT by a video enabled telemedicine application and verified that I am speaking with the correct person using two identifiers.  At orientation to the IOP program, Case Manager discussed the limitations of evaluation and management by telemedicine and the availability of in person appointments. The patient expressed understanding and agreed to proceed with virtual visits throughout the duration of the program.   Location:  Patient: Patient Home Provider: Home Office   History of Present Illness: MDD  Observations/Objective: Check In: Case Manager checked in with all participants to review discharge dates, insurance authorizations, work-related documents and needs from the treatment team regarding medications. Client stated needs and engaged in discussion.   Initial Therapeutic Activity: Counselor facilitated a check-in with group members to assess mood and current functioning. Client shared details of their mental health management since our last session, including challenges and successes. Counselor engaged group in discussion, covering the following topics: coping strategies for work related stress, outdoor activities to combat depression, grounding exercises, assertive communication of needs, changing up application of copings strategies and discharge planning. Client presents with moderate depression and moderate anxiety. Client denied any current SI/HI/psychosis.  Second Therapeutic Activity: Counselor introduced [employer], representative with [employer] to share about programming. Group Members asked questions and engaged in discussion, as Cristie Hem shared about Peer Support, Support Groups and the [employer]. Client stated that they are interested in connecting with the Grief and Loss and Women's Groups.  Check Out: Counselor closed program by allowing time to celebrate 2 graduating group  members. Counselor shared reflections on progress and allow space for group members to share well wishes and encouragements for the graduating client. Counselor prompted graduating client to share takeaways, reflect on progress and final thoughts for the group.Counselor prompted group members to share what self-care practice or productivity activity they will engage in today. Group members shared their plans with the group. Client endorsed safety plan to be followed to prevent safety issues.   Assessment and Plan: Clinician recommends that Client remain in IOP treatment to better manage mental health symptoms, stabilization and to address treatment plan goals. Clinician recommends adherence to crisis/safety plan, taking medications as prescribed, and following up with medical professionals if any issues arise.    Follow Up Instructions: Clinician will send Webex link for next session. The Client was advised to call back or seek an in-person evaluation if the symptoms worsen or if the condition fails to improve as anticipated.     I provided 180 minutes of non-face-to-face time during this encounter.     Lise Auer, LCSW

## 2020-07-07 NOTE — Progress Notes (Signed)
Virtual Visit via Video Note  I connected with Daisy [name] on 06/26/20 at  9:00 AM EDT by a video enabled telemedicine application and verified that I am speaking with the correct person using two identifiers.  At orientation to the IOP program, Case Manager discussed the limitations of evaluation and management by telemedicine and the availability of in person appointments. The patient expressed understanding and agreed to proceed with virtual visits throughout the duration of the program.  Location: Patient: patient home Provider: clinical home office   History of Present Illness: MDD   Observations/Objective: 9:00 - 10:00: Clinician led check-in regarding current stressors and situation. Clinician utilized active listening and empathetic response and validated patient emotions. Clinician facilitated processing group on pertinent issues. Patient arrived within time allowed and reports that she is feeling "physically hurting" Patient rates her mood at a 5 on a scale of 1-10 with 10 being great. Pt able to process. Pt engaged in discussion.     10:00 - 11:00: Cln led discussion on setting boundaries as a way to increase self-care. Group members discussed things that are stumbling blocks to them engaging in self-care and worked to determine what boundary could address that stumbling block. Group worked together to determine how to address the boundary. Cln brought in topics of boundaries, assertiveness, thought challenging, and self-care.      11:00 - 12:00: Cln introduced grounding techniques as a coping strategy. Cln utilized handout "Detaching from emotional pain" from [employer]. Group reviewed grounding strategies and how they can apply them to their every day life and in which situations.      Check Out: Counselor prompted group members to share what self-care practice or productivity activity they will engage today. Client endorsed safety plan to be followed to prevent  safety issues.  Assessment and Plan: Clinician recommends that Client remain in IOP treatment to better manage mental health symptoms, stabilization and to address treatment plan goals. Clinician recommends adherence to crisis/safety plan, taking medications as prescribed, and following up with medical professionals if any issues arise.  Follow Up Instructions: Clinician will send Webex link for next session. The Client was advised to call back or seek an in-person evaluation if the symptoms worsen or if the condition fails to improve as anticipated.   I provided 180 minutes of non-face-to-face time during this encounter.   Daisy Glass, LCSW

## 2020-07-08 ENCOUNTER — Other Ambulatory Visit: Payer: Self-pay | Admitting: Psychiatry

## 2020-07-08 DIAGNOSIS — F331 Major depressive disorder, recurrent, moderate: Secondary | ICD-10-CM

## 2020-07-08 DIAGNOSIS — F4010 Social phobia, unspecified: Secondary | ICD-10-CM

## 2020-07-09 ENCOUNTER — Encounter (HOSPITAL_COMMUNITY): Payer: Self-pay | Admitting: Psychiatry

## 2020-07-09 ENCOUNTER — Other Ambulatory Visit (HOSPITAL_COMMUNITY): Payer: Medicaid Other | Admitting: Psychiatry

## 2020-07-09 ENCOUNTER — Other Ambulatory Visit: Payer: Self-pay

## 2020-07-09 DIAGNOSIS — F331 Major depressive disorder, recurrent, moderate: Secondary | ICD-10-CM | POA: Diagnosis not present

## 2020-07-09 NOTE — Progress Notes (Signed)
Virtual Visit via Video Note  I connected with Daisy [name] on 07/09/20 at  9:00 AM EDT by a video enabled telemedicine application and verified that I am speaking with the correct person using two identifiers.  At orientation to the IOP program, Case Manager discussed the limitations of evaluation and management by telemedicine and the availability of in person appointments. The patient expressed understanding and agreed to proceed with virtual visits throughout the duration of the program.   Location:  Patient: Patient Home Provider: Home Office   History of Present Illness: MDD  Observations/Objective: Check In: Case Manager checked in with all participants to review discharge dates, insurance authorizations, work-related documents and needs from the treatment team regarding medications. Client stated needs and engaged in discussion.   Initial Therapeutic Activity: Counselor facilitated a check-in with group members to assess mood and current functioning. Client shared details of their mental health management since our last session, including challenges and successes. Counselor engaged group in discussion, covering the following topics: mobile crisis, national helplines, navigating relationships, body sensations, navigating life transitions, paranoia, nightmares and flashbacks, and combating exhaustion. Client presents with moderate depression and moderate anxiety. Client denied any current SI/HI/psychosis.  Second Therapeutic Activity: Counselor provided psychoeducation using research from [employer] about how the body senses and experiences emotions. Counselor prompted group members to shared their firsthand body sensations and their observations related to the research. Counselor prompted group members to share current coping strategies-what's working and where there are issues/concerns. Group members shared feedback on topics and additional ideas with each other.   Check Out: Counselor  prompted group members to share what self-care practice or productivity activity they will engage in today. Group members shared their plans with the group. Client endorsed safety plan to be followed to prevent safety issues.   Assessment and Plan: Clinician recommends that Client remain in IOP treatment to better manage mental health symptoms, stabilization and to address treatment plan goals. Clinician recommends adherence to crisis/safety plan, taking medications as prescribed, and following up with medical professionals if any issues arise.    Follow Up Instructions: Clinician will send Webex link for next session. The Client was advised to call back or seek an in-person evaluation if the symptoms worsen or if the condition fails to improve as anticipated.     I provided 180 minutes of non-face-to-face time during this encounter.     Lise Auer, LCSW

## 2020-07-11 ENCOUNTER — Other Ambulatory Visit: Payer: Self-pay

## 2020-07-11 ENCOUNTER — Encounter (HOSPITAL_COMMUNITY): Payer: Self-pay

## 2020-07-11 ENCOUNTER — Other Ambulatory Visit (HOSPITAL_COMMUNITY): Payer: Medicaid Other | Admitting: Psychiatry

## 2020-07-11 DIAGNOSIS — F331 Major depressive disorder, recurrent, moderate: Secondary | ICD-10-CM | POA: Diagnosis not present

## 2020-07-11 NOTE — Progress Notes (Signed)
Virtual Visit via Video Note  I connected with Daisy [name] on 07/11/20 at  9:00 AM EDT by a video enabled telemedicine application and verified that I am speaking with the correct person using two identifiers.  At orientation to the IOP program, Case Manager discussed the limitations of evaluation and management by telemedicine and the availability of in person appointments. The patient expressed understanding and agreed to proceed with virtual visits throughout the duration of the program.   Location:  Patient: Patient Home Provider: Home Office   History of Present Illness: MDD  Observations/Objective: Check In: Case Manager checked in with all participants to review discharge dates, insurance authorizations, work-related documents and needs from the treatment team regarding medications. Client stated needs and engaged in discussion. Client presents with moderate depression and moderate anxiety. Client denied any current SI/HI/psychosis.  Initial Therapeutic Activity: Counselor introduced our guest speaker, Einar Grad, [facility] Pharmacist, who shared about psychiatric medications, side effects, treatment considerations and how to communicate with medical professionals. Group Members asked questions and shared medication concerns. Counselor prompted group members to reference a worksheet called, "Body Scan" to jot down questions and concerns about their physical health in preparation for their upcoming appointments with medical professionals. Counselor encouraged routine medical check-ups, preparing for appointments, following up with recommendations and seeking specialist if needed.  Second Therapeutic Activity: Counselor introduced Agricultural consultant, Demetra Shiner, Director of Wellness to present information on [employer] and Benefits. Clients engaged in activities and discussion, personalizing the information to their individual circumstances. Client motivated to make small changes in  wellness practices to improve overall mental health.  Check Out: Counselor prompted group members to share what self-care practice or productivity activity they will engage in today. Group members shared their plans with the group. Client endorsed safety plan to be followed to prevent safety issues.   Assessment and Plan: Clinician recommends that Client remain in IOP treatment to better manage mental health symptoms, stabilization and to address treatment plan goals. Clinician recommends adherence to crisis/safety plan, taking medications as prescribed, and following up with medical professionals if any issues arise.    Follow Up Instructions: Clinician will send Webex link for next session. The Client was advised to call back or seek an in-person evaluation if the symptoms worsen or if the condition fails to improve as anticipated.     I provided 180 minutes of non-face-to-face time during this encounter.     Lise Auer, LCSW

## 2020-07-11 NOTE — Progress Notes (Signed)
Chart reviewed medication was refilled by MD Eappen. Patient to follow-up with pharmacy. NP called patient to make her aware. No answer. Didn't leave voice mail. Case management to follow-up with patient.

## 2020-07-12 ENCOUNTER — Other Ambulatory Visit: Payer: Self-pay

## 2020-07-12 ENCOUNTER — Other Ambulatory Visit (HOSPITAL_COMMUNITY): Payer: Medicaid Other | Admitting: Psychiatry

## 2020-07-12 ENCOUNTER — Telehealth (HOSPITAL_COMMUNITY): Payer: Self-pay | Admitting: Psychiatry

## 2020-07-16 ENCOUNTER — Other Ambulatory Visit (HOSPITAL_COMMUNITY): Payer: Medicaid Other | Admitting: Psychiatry

## 2020-07-16 ENCOUNTER — Encounter (HOSPITAL_COMMUNITY): Payer: Self-pay | Admitting: Psychiatry

## 2020-07-16 ENCOUNTER — Other Ambulatory Visit: Payer: Self-pay

## 2020-07-16 DIAGNOSIS — F331 Major depressive disorder, recurrent, moderate: Secondary | ICD-10-CM

## 2020-07-16 NOTE — Progress Notes (Signed)
Virtual Visit via Video Note  I connected with Daisy [name] on 07/16/20 at  9:00 AM EDT by a video enabled telemedicine application and verified that I am speaking with the correct person using two identifiers.  At orientation to the IOP program, Case Manager discussed the limitations of evaluation and management by telemedicine and the availability of in person appointments. The patient expressed understanding and agreed to proceed with virtual visits throughout the duration of the program.   Location:  Patient: Patient Home Provider: Home Office   History of Present Illness: MDD  Observations/Objective: Check In: Case Manager checked in with all participants to review discharge dates, insurance authorizations, work-related documents and needs from the treatment team regarding medications. Client stated needs and engaged in discussion.   Initial Therapeutic Activity: Counselor facilitated a check-in with group members to assess mood and current functioning. Client shared details of their mental health management since our last session, including challenges and successes. Counselor engaged group in discussion, covering the following topics: father's day grief responses and reminders, TIPP DBT skills, flashbacks, memory loss issues, skill application, and using art in coping. Client presents with moderate depression and moderate anxiety. Client denied any current SI/HI/psychosis.   Second Therapeutic Activity: Counselor engaged group in the creation of their current ECOmap, in order to have a snapshot of support systems and relationship with entities/individuals involved in. Counselor walked group through the steps of the activity creating dynamic lines, energy lines and boundary types. Group then shared reflections and results of their work. Client shared and identified 2-3 small goals they would like in addressing issues to reduce stress and stabilize relationships.   Check Out: Counselor  closed program by allowing time to celebrate a graduating group member. Counselor shared reflections on progress and allow space for group members to share well wishes and encouragements with the graduating client. Counselor prompted graduating client to share takeaways, reflect on progress and final thoughts for the group. Counselor prompted group members to share what self-care practice or productivity activity they will engage in today. Group members shared their plans with the group. Client endorsed safety plan to be followed to prevent safety issues.   Assessment and Plan: Clinician recommends that Client remain in IOP treatment to better manage mental health symptoms, stabilization and to address treatment plan goals. Clinician recommends adherence to crisis/safety plan, taking medications as prescribed, and following up with medical professionals if any issues arise.    Follow Up Instructions: Clinician will send Webex link for next session. The Client was advised to call back or seek an in-person evaluation if the symptoms worsen or if the condition fails to improve as anticipated.     I provided 180 minutes of non-face-to-face time during this encounter.     Lise Auer, LCSW

## 2020-07-17 ENCOUNTER — Telehealth (INDEPENDENT_AMBULATORY_CARE_PROVIDER_SITE_OTHER): Payer: No Typology Code available for payment source | Admitting: Psychiatry

## 2020-07-17 ENCOUNTER — Encounter: Payer: Self-pay | Admitting: Psychiatry

## 2020-07-17 ENCOUNTER — Other Ambulatory Visit: Payer: Self-pay

## 2020-07-17 DIAGNOSIS — R413 Other amnesia: Secondary | ICD-10-CM

## 2020-07-17 DIAGNOSIS — Z91199 Patient's noncompliance with other medical treatment and regimen due to unspecified reason: Secondary | ICD-10-CM | POA: Insufficient documentation

## 2020-07-17 DIAGNOSIS — F4010 Social phobia, unspecified: Secondary | ICD-10-CM | POA: Diagnosis not present

## 2020-07-17 DIAGNOSIS — Z9111 Patient's noncompliance with dietary regimen: Secondary | ICD-10-CM

## 2020-07-17 DIAGNOSIS — F331 Major depressive disorder, recurrent, moderate: Secondary | ICD-10-CM

## 2020-07-17 MED ORDER — DULOXETINE HCL 20 MG PO CPEP: 40.0000 mg | ORAL_CAPSULE | Freq: Every day | ORAL | 1 refills | Status: DC

## 2020-07-17 NOTE — Progress Notes (Signed)
Virtual Visit via Video Note  I connected with Daisy [name] on 07/17/20 at  4:00 PM EDT by a video enabled telemedicine application and verified that I am speaking with the correct person using two identifiers.  Location Provider Location : [facility] Patient Location : Home  Participants: Patient , Provider   I discussed the limitations of evaluation and management by telemedicine and the availability of in person appointments. The patient expressed understanding and agreed to proceed.   I discussed the assessment and treatment plan with the patient. The patient was provided an opportunity to ask questions and all were answered. The patient agreed with the plan and demonstrated an understanding of the instructions.   The patient was advised to call back or seek an in-person evaluation if the symptoms worsen or if the condition fails to improve as anticipated.    [facility] MD OP Progress Note  07/17/2020 5:40 PM Daisy [name]  MRN:  [id number]  Chief Complaint:  Chief Complaint   Follow-up; Anxiety; Depression    HPI: Daisy [name] is a 40 year old female, currently on leave of absence from work, lives with her boyfriend, has a history of depression, social anxiety, hypertension, migraine headaches was evaluated by telemedicine today.  Patient today reports she is currently in [facility].  She is in the program 3 days a week.  She reports the program is helpful.  Patient however reports she continues to struggle with depression.  She has not noticed much benefit yet with regards to her depressive symptoms with the IOP or the medication.  She reports she is compliant on the Cymbalta 20 mg.  She reports she does have constipation however she had constipation even before being on the medication.  She is interested in dosage increase.  Patient struggles with sadness, lack of motivation, low energy, reduced appetite, concentration problems on a regular basis.  She reports sleep is  restless, she uses hydroxyzine at night and stopped the trazodone since she did not like the effect of trazodone.  The hydroxyzine does help her to relax and helps with sleep when she takes it.  She however does not take it every night.  Patient also reports she has been using this app called Calm, and that helps her to relax also.  Patient continues to have memory problems and reports she forgets conversation and simple things.  She however has been noncompliant with labs-TSH, vitamin B12 ordered last visit.  Patient today reports she had no idea she had to get it done even though this was discussed very clearly during her last visit and instructions were provided.  Patient denies any suicidality, homicidality or perceptual disturbances.  Patient reports she is in the process of getting established with a therapist for individual therapy-Ms.Hulen Luster in [place].    Visit Diagnosis:    ICD-10-CM   1. MDD (major depressive disorder), recurrent episode, moderate (HCC)  F33.1 DULoxetine (CYMBALTA) 20 MG capsule    TSH    Vitamin B12    2. Social anxiety disorder  F40.10 DULoxetine (CYMBALTA) 20 MG capsule    TSH    3. Memory problem  R41.3 TSH    Vitamin B12    4. Noncompliance with treatment plan  Z91.11       Past Psychiatric History: Reviewed past psychiatric history from progress note on 11/26/2018.  Past trials of medications- Prozac Lexapro Wellbutrin Elavil Seroquel Klonopin  Past Medical History:  Past Medical History:  Diagnosis Date   Anxiety  Depression    Hypertension    Migraine     Past Surgical History:  Procedure Laterality Date   NO PAST SURGERIES      Family Psychiatric History: Reviewed family psychiatric history from progress note on 11/26/2018  Family History:  Family History  Problem Relation Age of Onset   Alcohol abuse Maternal Aunt    Depression Maternal Aunt    Drug abuse Maternal Uncle    Alcohol abuse Maternal Grandmother      Social History: Reviewed social history from progress note on 11/26/2018 Social History   Socioeconomic History   Marital status: Single    Spouse name: Not on file   Number of children: 1   Years of education: Not on file   Highest education level: Not on file  Occupational History   Not on file  Tobacco Use   Smoking status: Never   Smokeless tobacco: Never  Substance and Sexual Activity   Alcohol use: Yes    Comment: occasionally   Drug use: No   Sexual activity: Not on file  Other Topics Concern   Not on file  Social History Narrative   Not on file   Social Determinants of Health   Financial Resource Strain: Not on file  Food Insecurity: Not on file  Transportation Needs: Not on file  Physical Activity: Not on file  Stress: Not on file  Social Connections: Not on file    Allergies:  Allergies  Allergen Reactions   Atenolol     Hair loss and skin eruption Hair loss and skin eruption Hair loss and skin eruption Hair loss and skin eruption Hair loss and skin eruption    Cephalexin Diarrhea    Metabolic Disorder Labs: No results found for: HGBA1C, MPG No results found for: PROLACTIN Lab Results  Component Value Date   CHOL 189 12/02/2013   TRIG 57.0 12/02/2013   HDL 46.90 12/02/2013   CHOLHDL 4 12/02/2013   VLDL 11.4 12/02/2013   LDLCALC 131 (H) 12/02/2013   Lab Results  Component Value Date   TSH 1.34 12/02/2013    Therapeutic Level Labs: No results found for: LITHIUM No results found for: VALPROATE No components found for:  CBMZ  Current Medications: Current Outpatient Medications  Medication Sig Dispense Refill   acetaminophen (TYLENOL) 325 MG tablet Take 650 mg by mouth every 6 (six) hours as needed for headache.     amLODipine (NORVASC) 5 MG tablet      [employer] STOOL SOFTENER 100 MG capsule Take by mouth daily.     fluticasone (FLONASE) 50 MCG/ACT nasal spray Place 1 spray into both nostrils daily.     hydrOXYzine (ATARAX/VISTARIL) 25  MG tablet TAKE 1 TABLET 2 (TWO) TIMES DAILY AS NEEDED FOR ANXIETY OR ITCHING. FOR ITCHING , SEVERE ANXIETY (Patient taking differently: Take 25 mg by mouth. Takes at bedtime as needed) 180 tablet 1   Multiple Vitamins-Minerals (MULTIVITAMIN PO) Take 1 tablet by mouth daily.     Naproxen Sodium 220 MG CAPS Take 1 capsule by mouth as needed (headaches or cramping).     Olopatadine HCl 0.2 % SOLN Apply 1 drop to eye 2 (two) times daily.     Vitamin D, Ergocalciferol, (DRISDOL) 1.25 MG (50000 UNIT) CAPS capsule Take 50,000 Units by mouth once a week.     acyclovir (ZOVIRAX) 400 MG tablet Take 400 mg by mouth 3 (three) times daily. (Patient not taking: Reported on 07/17/2020)     azithromycin (ZITHROMAX) 250 MG tablet  Take 250 mg by mouth as directed. (Patient not taking: Reported on 07/17/2020)     DULoxetine (CYMBALTA) 20 MG capsule Take 2 capsules (40 mg total) by mouth daily. 60 capsule 1   fluconazole (DIFLUCAN) 100 MG tablet Take by mouth. (Patient not taking: Reported on 07/17/2020)     No current facility-administered medications for this visit.     Musculoskeletal: Strength & Muscle Tone:  UTA Gait & Station:  UTA Patient leans: N/A  Psychiatric Specialty Exam: Review of Systems  Psychiatric/Behavioral:  Positive for decreased concentration, dysphoric mood and sleep disturbance. The patient is nervous/anxious.   All other systems reviewed and are negative.  There were no vitals taken for this visit.There is no height or weight on file to calculate BMI.  General Appearance: Casual  Eye Contact:  Minimal  Speech:  Clear and Coherent  Volume:  Normal  Mood:  Anxious and Depressed  Affect:  Congruent  Thought Process:  Goal Directed and Descriptions of Associations: Intact  Orientation:  Full (Time, Place, and Person)  Thought Content: Logical   Suicidal Thoughts:  No  Homicidal Thoughts:  No  Memory:  Immediate;   Fair Recent;   Fair Remote;   Poor  Judgement:  Fair  Insight:   Fair  Psychomotor Activity:  Normal  Concentration:  Concentration: Fair and Attention Span: Good  Recall:  Good  Fund of Knowledge: Fair  Language: Fair  Akathisia:  No  Handed:  Right  AIMS (if indicated): not done  Assets:  Communication Skills Desire for Improvement Social Support Talents/Skills Transportation Vocational/Educational  ADL's:  Intact  Cognition: WNL  Sleep:   Restless   Screenings: GAD-7    Flowsheet Row Video Visit from 06/05/2020 in [facility]  Total GAD-7 Score 14      PHQ2-9    Flowsheet Row Video Visit from 07/17/2020 in [facility] Counselor from 06/14/2020 in [facility] Video Visit from 06/05/2020 in [facility] Video Visit from 03/07/2020 in [facility]  PHQ-2 Total Score 5 6 5 4   PHQ-9 Total Score 14 26 21 15       Flowsheet Row Counselor from 06/19/2020 in [facility] from 06/14/2020 in [facility] Video Visit from 06/05/2020 in [facility] Low Risk Error: Question 6 not populated Low Risk        Assessment and Plan: Daisy [name] is a 40 year old African-American female, employed, lives in [place], has a history of depression, anxiety, bereavement, hypertension, migraine headache was evaluated by telemedicine today.  Patient with psychosocial stressors of recent death of her mother, COVID-19 infection, relationship struggles, work-related stressors continues to struggle with depression, anxiety, memory problems.  Patient has been noncompliant with lab.  Discussed plan as noted below.  Plan MDD-unstable Increase Cymbalta to 40 mg p.o. daily Discontinue trazodone for side effects Continue MH IOP with [facility]. She is also in the process of getting established with an individual therapist-Ms. Hulen Luster in [place].  We will coordinate care.  Social anxiety disorder-unstable Continue psychotherapy sessions. Continue IOP Increase Cymbalta to 40 mg p.o. daily  Memory problems-unstable Patient was referred for neuropsychological testing.  Pending Will also consider referral to neurology if she continues to have worsening memory problems.  Noncompliance with treatment plan-patient has been noncompliant with labs ordered last visit.  Encouraged compliance.  Will fax lab slip to [facility] in [place] based on preference.  Patient advised to come into the office in person for her next visit due to virtual visit being limited and given the fact that patient is not making any progress.  Follow-up in clinic in 3 to 4 weeks or sooner in person.  This note was generated in part or whole with voice recognition software. Voice recognition is usually quite accurate but there are transcription errors that can and very often do occur. I apologize for any typographical errors that were not detected and corrected.        Ursula Alert, MD 07/18/2020, 8:29 AM

## 2020-07-18 ENCOUNTER — Telehealth: Payer: Self-pay

## 2020-07-18 ENCOUNTER — Other Ambulatory Visit (HOSPITAL_COMMUNITY): Payer: Medicaid Other | Admitting: Psychiatry

## 2020-07-18 ENCOUNTER — Encounter (HOSPITAL_COMMUNITY): Payer: Self-pay

## 2020-07-18 DIAGNOSIS — Z634 Disappearance and death of family member: Secondary | ICD-10-CM

## 2020-07-18 DIAGNOSIS — F331 Major depressive disorder, recurrent, moderate: Secondary | ICD-10-CM | POA: Diagnosis not present

## 2020-07-18 NOTE — Progress Notes (Signed)
Virtual Visit via Video Note  I connected with Daisy [name] on 07/18/20 at  9:00 AM EDT by a video enabled telemedicine application and verified that I am speaking with the correct person using two identifiers.  At orientation to the IOP program, Case Manager discussed the limitations of evaluation and management by telemedicine and the availability of in person appointments. The patient expressed understanding and agreed to proceed with virtual visits throughout the duration of the program.   Location:  Patient: Patient Home Provider: Home Office   History of Present Illness: MDD  Observations/Objective: Check In: Case Manager checked in with all participants to review discharge dates, insurance authorizations, work-related documents and needs from the treatment team regarding medications. Client stated needs and engaged in discussion.   Initial Therapeutic Activity: Counselor facilitated a check-in with group members to assess mood and current functioning. Client shared details of their mental health management since our last session, including challenges and successes. Counselor engaged group in discussion, covering the following topics: healthy emotional releases, discharge planning, utilization of coping skills, crafting to process emotions, rebuilding support system and making new connections in the community. Client presents with moderate depression and moderate anxiety. Client denied any current SI/HI/psychosis.   Second Therapeutic Activity: Counselor introduced quest speaker, Franco Collet, who presented information on self-care practices and implementation. Group members engaged in discussion and made plans for improving or enhancing their self-care routines.   Check Out: Counselor prompted group members to share what self-care practice or productivity activity they will engage in today. Group members shared their plans with the group. Client endorsed safety plan to be followed  to prevent safety issues.   Assessment and Plan: Clinician recommends that Client remain in IOP treatment to better manage mental health symptoms, stabilization and to address treatment plan goals. Clinician recommends adherence to crisis/safety plan, taking medications as prescribed, and following up with medical professionals if any issues arise.    Follow Up Instructions: Clinician will send Webex link for next session. The Client was advised to call back or seek an in-person evaluation if the symptoms worsen or if the condition fails to improve as anticipated.     I provided 180 minutes of non-face-to-face time during this encounter.     Lise Auer, LCSW

## 2020-07-18 NOTE — Telephone Encounter (Signed)
faxed and confirmed order sent to [facility] in [place]  [phone]

## 2020-07-19 ENCOUNTER — Other Ambulatory Visit: Payer: Self-pay

## 2020-07-19 ENCOUNTER — Encounter (HOSPITAL_COMMUNITY): Payer: Self-pay

## 2020-07-19 ENCOUNTER — Other Ambulatory Visit (HOSPITAL_COMMUNITY): Payer: Medicaid Other | Admitting: Psychiatry

## 2020-07-19 ENCOUNTER — Encounter: Payer: Self-pay | Admitting: Psychology

## 2020-07-19 DIAGNOSIS — F331 Major depressive disorder, recurrent, moderate: Secondary | ICD-10-CM

## 2020-07-19 LAB — TSH: TSH: 2.57 u[IU]/mL (ref 0.450–4.500)

## 2020-07-19 LAB — VITAMIN B12: Vitamin B-12: 830 pg/mL (ref 232–1245)

## 2020-07-19 NOTE — Progress Notes (Signed)
Virtual Visit via Video Note  I connected with Daisy [name] on 07/19/20 at  9:00 AM EDT by a video enabled telemedicine application and verified that I am speaking with the correct person using two identifiers.  At orientation to the IOP program, Case Manager discussed the limitations of evaluation and management by telemedicine and the availability of in person appointments. The patient expressed understanding and agreed to proceed with virtual visits throughout the duration of the program.   Location:  Patient: Patient Home Provider: Home Office   History of Present Illness: MDD  Observations/Objective: Check In: Case Manager checked in with all participants to review discharge dates, insurance authorizations, work-related documents and needs from the treatment team regarding medications. Client stated needs and engaged in discussion. Counselor engaged group in discussion, covering the following topics: community resources, jobs that are [place] accommodating, managing to do lists and life transitions. Client presents with moderate depression and moderate anxiety. Client denied any current SI/HI/psychosis.  Initial Therapeutic Activity: Counselor introduced Rolin Barry, [place] Chaplain to present information and discussion on Grief and Loss. Group members engaged in discussion, sharing how grief impacts them, what comforts them, what emotions are felt, labeling losses, etc. After guest speaker logged off, Counselor prompted group to spend 10-15 minutes journaling to process personal grief and loss situations. Counselor processed entries with group and client's identified areas for additional processing in individual therapy. Client noted their individual issues of grief and how they are coping.   Second Therapeutic Activity: Counselor introduced [employer], representative with [employer] to share about programming. Group Members asked questions and engaged in discussion, as Cristie Hem shared  about Peer Support, Support Groups and the [employer]. Client stated that they are interested in connecting with the peer support.  Check Out: Counselor closed program by allowing time to celebrate two graduating group members. Counselor shared reflections on progress and allow space for group members to share well wishes and encouragements with the graduating client. Counselor prompted graduating client to share takeaways, reflect on progress and final thoughts for the group. Client endorsed safety plan to be followed to prevent safety issues.   Assessment and Plan: Clinician recommends that Client remain in IOP treatment to better manage mental health symptoms, stabilization and to address treatment plan goals. Clinician recommends adherence to crisis/safety plan, taking medications as prescribed, and following up with medical professionals if any issues arise.    Follow Up Instructions: Clinician will send Webex link for next session. The Client was advised to call back or seek an in-person evaluation if the symptoms worsen or if the condition fails to improve as anticipated.     I provided 180 minutes of non-face-to-face time during this encounter.     Lise Auer, LCSW

## 2020-07-23 ENCOUNTER — Other Ambulatory Visit (HOSPITAL_COMMUNITY): Payer: Medicaid Other | Admitting: Psychiatry

## 2020-07-23 ENCOUNTER — Other Ambulatory Visit: Payer: Self-pay

## 2020-07-23 DIAGNOSIS — F331 Major depressive disorder, recurrent, moderate: Secondary | ICD-10-CM

## 2020-07-23 DIAGNOSIS — Z634 Disappearance and death of family member: Secondary | ICD-10-CM

## 2020-07-24 ENCOUNTER — Encounter (HOSPITAL_COMMUNITY): Payer: Self-pay | Admitting: Psychiatry

## 2020-07-24 NOTE — Progress Notes (Signed)
Virtual Visit via Video Note  I connected with Daisy [name] n 07/23/20 at  9:00 AM EDT by a video enabled telemedicine application and verified that I am speaking with the correct person using two identifiers.  At orientation to the IOP program, Case Manager discussed the limitations of evaluation and management by telemedicine and the availability of in person appointments. The patient expressed understanding and agreed to proceed with virtual visits throughout the duration of the program.   Location:  Patient: Patient Home Provider: Home Office   History of Present Illness: MDD  Observations/Objective: Check In: Case Manager checked in with all participants to review discharge dates, insurance authorizations, work-related documents and needs from the treatment team regarding medications. Client stated needs and engaged in discussion.   Initial Therapeutic Activity: Counselor facilitated a check-in with group members to assess mood and current functioning. Client shared details of their mental health management since our last session, including challenges and successes. Counselor engaged group in discussion, covering the following topics: cognitive coping skills, boundary setting, assertive communication, managing long term legal issues, benefits of faith communities, trauma-based counseling, and grief and loss work. Client presents with mild depression and moderate anxiety. Client denied any current SI/HI/psychosis.  Second Therapeutic Activity: Counselor presented 2 psychoeducational videos on cognitive coping and listening to [place] DBT skill. Counselor reviewed primary points with group, processing their thoughts on the concepts. The Counselor challenged group on practical skill application in their own though patterns and in conversations with others. Client engaged well in discussion and in making a plan for skill application.  Check Out: Counselor prompted group members to  identify one self-care practice or productivity activity they would like to engage in today. Client plans to spend time with son and catch up on missed sleep. Client endorsed safety plan to be followed to prevent safety issues.   Assessment and Plan: Clinician recommends that Client remain in IOP treatment to better manage mental health symptoms, stabilization and to address treatment plan goals. Clinician recommends adherence to crisis/safety plan, taking medications as prescribed, and following up with medical professionals if any issues arise.    Follow Up Instructions: Clinician will send Webex link for next session. The Client was advised to call back or seek an in-person evaluation if the symptoms worsen or if the condition fails to improve as anticipated.     I provided 150 minutes of non-face-to-face time during this encounter.     Lise Auer, LCSW

## 2020-07-25 ENCOUNTER — Encounter (HOSPITAL_COMMUNITY): Payer: Self-pay | Admitting: Psychiatry

## 2020-07-25 ENCOUNTER — Other Ambulatory Visit (HOSPITAL_COMMUNITY): Payer: Medicaid Other | Admitting: Psychiatry

## 2020-07-25 ENCOUNTER — Other Ambulatory Visit: Payer: Self-pay

## 2020-07-25 DIAGNOSIS — F331 Major depressive disorder, recurrent, moderate: Secondary | ICD-10-CM | POA: Diagnosis not present

## 2020-07-25 DIAGNOSIS — F4310 Post-traumatic stress disorder, unspecified: Secondary | ICD-10-CM

## 2020-07-25 NOTE — Progress Notes (Signed)
Virtual Visit via Video Note  I connected with [name] on 07/25/20 at  9:00 AM EDT by a video enabled telemedicine application and verified that I am speaking with the correct person using two identifiers.  At orientation to the IOP program, Case Manager discussed the limitations of evaluation and management by telemedicine and the availability of in person appointments. The patient expressed understanding and agreed to proceed with virtual visits throughout the duration of the program.   Location:  Patient: Patient Home Provider: Home Office   History of Present Illness: MDD and PTSD  Observations/Objective: Check In: Case Manager checked in with all participants to review discharge dates, insurance authorizations, work-related documents and needs from the treatment team regarding medications. Client stated needs and engaged in discussion. Client presents with mild depression and moderate anxiety. Client denied any current SI/HI/psychosis.  Initial Therapeutic Activity: Counselor introduced our guest speaker, Einar Grad, [facility] Pharmacist, who shared about psychiatric medications, side effects, treatment considerations and how to communicate with medical professionals. Group Members asked questions and shared medication concerns. Counselor prompted group members to reference a worksheet called, "Body Scan" to jot down questions and concerns about their physical health in preparation for their upcoming appointments with medical professionals. Client following up with OBGYN due to pain issues. Counselor encouraged routine medical check-ups, preparing for appointments, following up with recommendations and seeking specialist if needed.  Client did not return to group after break. Before break she stated she had not slept well overnight due to PTSD symptoms and that her connection was breaking up.   Assessment and Plan: Clinician recommends that Client remain in IOP treatment to better  manage mental health symptoms, stabilization and to address treatment plan goals. Clinician recommends adherence to crisis/safety plan, taking medications as prescribed, and following up with medical professionals if any issues arise.    Follow Up Instructions: Clinician will send Webex link for next session. The Client was advised to call back or seek an in-person evaluation if the symptoms worsen or if the condition fails to improve as anticipated.     I provided 90 minutes of non-face-to-face time during this encounter.     Lise Auer, LCSW

## 2020-07-26 ENCOUNTER — Other Ambulatory Visit (HOSPITAL_COMMUNITY): Payer: Medicaid Other | Admitting: Psychiatry

## 2020-07-26 ENCOUNTER — Other Ambulatory Visit: Payer: Self-pay

## 2020-07-26 ENCOUNTER — Encounter (HOSPITAL_COMMUNITY): Payer: Self-pay

## 2020-07-26 DIAGNOSIS — F331 Major depressive disorder, recurrent, moderate: Secondary | ICD-10-CM

## 2020-07-26 DIAGNOSIS — F4310 Post-traumatic stress disorder, unspecified: Secondary | ICD-10-CM

## 2020-07-26 NOTE — Patient Instructions (Signed)
D:  Patient completed MH-IOP today.  A:  Discharge as scheduled.  Follow up with Dr. Shea Evans on 08-14-20 and pt to contact Rigoberto Noel (therapist) for an appt.  Return to work as scheduled on 07-31-20.  Encouraged support groups.  R: Pt receptive with discharging today.

## 2020-07-26 NOTE — Progress Notes (Signed)
Virtual Visit via Video Note  I connected with [name] on 07/26/20 at  9:00 AM EDT by a video enabled telemedicine application and verified that I am speaking with the correct person using two identifiers.  At orientation to the IOP program, Case Manager discussed the limitations of evaluation and management by telemedicine and the availability of in person appointments. The patient expressed understanding and agreed to proceed with virtual visits throughout the duration of the program.   Location:  Patient: Patient Home Provider: Home Office   History of Present Illness: MDD and PTSD  Observations/Objective: Check In: Case Manager checked in with all participants to review discharge dates, insurance authorizations, work-related documents and needs from the treatment team regarding medications. Client stated needs and engaged in discussion. Counselor introduced new Client to group with group welcoming them and starting the joining process.   Initial Therapeutic Activity: Counselor facilitated a check-in with group members to assess mood and current functioning. Client shared details of their mental health management since our last session, including challenges and successes. Counselor engaged group in discussion, covering the following topics: cognitive coping, Client-Counselor relationship, discharge planning, boundary setting with family, loneliness, long term outcomes, and the therapeutic process. Client presents with moderate depression and moderate anxiety. Client denied any current SI/HI/psychosis.  Second Therapeutic Activity: Counselor presented psychoeducation on grief and loss work. Counselor allowed time for group members to complete one of two worksheets, My Stages of Grief and A Good-Bye Letter. Counselor engaged group in discussion on how they are feeling and coping with Grief and Loss. Client shared about specific losses and experiences after the loss. Counselor provided  additional resources on grief and loss work, support groups, counseling and articles. Client intends to do more work on specific grief reminders.   Check Out: Counselor closed program by allowing time to celebrate a graduating group member. Counselor shared reflections on progress and allow space for group members to share well wishes and encouragements with the graduating client. Counselor prompted graduating client to share takeaways, reflect on progress and final thoughts for the group. Client endorsed safety plan to be followed to prevent safety issues.   Assessment and Plan: Clinician recommends that Client step down from IOP to individual counseling and medication management to treat bereavement, depression, anxiety and PTSD symptoms. Clinician recommends adherence to crisis/safety plan, taking medications as prescribed, and following up with medical professionals if any issues arise.    Follow Up Instructions: Clinician will send Webex link for next session. The Client was advised to call back or seek an in-person evaluation if the symptoms worsen or if the condition fails to improve as anticipated.     I provided 180 minutes of non-face-to-face time during this encounter.     Lise Auer, LCSW
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
## 2020-07-27 NOTE — Progress Notes (Signed)
  [facility]ensive [facility] Program Discharge Summary  Daisy [name] [id number]  Admission date: 07/11/2020 Discharge date: 07/26/2020  Reason for admission: Per admission assessment note: Daisy [name] was referred to Intensive Outpatient  program by her primary psychiatrist MD Eappen due to worsening depression related to grief and loss.  Reports a history of major depressive disorder and anxiety.  Reported feeling down and depressed due to the passing of her mother reports multiple relatives that have passed away within a short amount of time.  States she continues to have difficulty coping, mood irritability, poor concentration and crying spells.  Patient presents flat, guarded but pleasant. Daisy [name] is currently she is denying suicidal or homicidal ideations.  Denies auditory or visual hallucinations.  Reports she is prescribed Cymbalta with a recent titration from Lexapro.  Patient reports she has not been taking medications as indicated. "  Has been experiencing brain fog" unsure if this is related to medications.    Progress in Program Toward Treatment Goals: Ongoing, patient attended and participated with daily group session with active and engaged participation.  Patiently recently stepdown and intensive outpatient programming for additional therapy/group services.  Attempted to follow-up with patient at discharge no answer.  Case management reports making additional outpatient resources available.   Progress (rationale): Keep follow-up with Dr Shea Evans on 08/14/20 and therapist Rigoberto Noel. Patient was provided with [facility] and or Aftercare group with B.Morris.   Take all medications as prescribed. Keep all follow-up appointments as scheduled.  Do not consume alcohol or use illegal drugs while on prescription medications. Report any adverse effects from your medications to your primary care provider promptly.  In the event of recurrent symptoms or  worsening symptoms, call 911, a crisis hotline, or go to the nearest emergency department for evaluation.    Ricky Ala NP 07/27/2020

## 2020-07-31 NOTE — Progress Notes (Signed)
On 7/5 Anavey Coombes "contacted the office this morning wanting to send over some FMLA paper work for Dr. Shea Evans to fill out and Dr. Shea Evans is out until Monday. Patient states she just finished the IOP program and needs an appointment to get the papers filled out but Dr. Shea Evans does not have anything available until the patient's original appointment on 08/14/20. Patient states she was suppose to have returned to work today beings she finished the program but did not go to work and wanted to make sure she was not going to loose her job because of this. Patient's call back [phone]."   11:05- NP T.Ezekeil Bethel spoke to [employer] via the telephone.  She is requesting  short term disability form to be completed so that she is able to follow-up with individual therapist.   Kynadie stated" the IOP wasn't helpful and I need individual therapy." She reports if she returns to work then she will have to completed  a lot of paperwork all over again."  Additionally, MD Eappen wanted to see me face to face."    Patient recently completed intensive outpatient programming on 07/26/2020.  This NP is unable to provide work accommodations for short term disability until individual therapy is completed.  Patient denied that she has an appointment schedule with any therapist currently.   Keyleen was made aware that this a special accommodation typically comes from individual therapist.  Patient was reluctant to plan.  Stated that she is not happy with this service and would like to cancel her visit with Dr. Shea Evans on 7/19.  Encouraged patient to follow-up with therapist and/or psychiatrist for short term disability. Patient to return back to work on 07/31/2020.    Patient was provided with suggestions to consider virtual visits however patient declined.

## 2020-08-01 ENCOUNTER — Encounter: Payer: Self-pay | Admitting: Nurse Practitioner

## 2020-08-01 ENCOUNTER — Telehealth: Payer: Self-pay | Admitting: Nurse Practitioner

## 2020-08-01 DIAGNOSIS — B373 Candidiasis of vulva and vagina: Secondary | ICD-10-CM

## 2020-08-01 DIAGNOSIS — B3731 Acute candidiasis of vulva and vagina: Secondary | ICD-10-CM

## 2020-08-01 MED ORDER — FLUCONAZOLE 150 MG PO TABS: 150.0000 mg | ORAL_TABLET | Freq: Once | ORAL | 0 refills | Status: AC

## 2020-08-01 NOTE — Progress Notes (Signed)
Virtual Visit Consent   Daisy [name], you are scheduled for a virtual visit with Daisy [name] Done, [place], a [facility] provider, today.     Just as with appointments in the office, your consent must be obtained to participate.  Your consent will be active for this visit and any virtual visit you may have with one of our providers in the next 365 days.     If you have a MyChart account, a copy of this consent can be sent to you electronically.  All virtual visits are billed to your insurance company just like a traditional visit in the office.    As this is a virtual visit, video technology does not allow for your provider to perform a traditional examination.  This may limit your provider's ability to fully assess your condition.  If your provider identifies any concerns that need to be evaluated in person or the need to arrange testing (such as labs, EKG, etc.), we will make arrangements to do so.     Although advances in technology are sophisticated, we cannot ensure that it will always work on either your end or our end.  If the connection with a video visit is poor, the visit may have to be switched to a telephone visit.  With either a video or telephone visit, we are not always able to ensure that we have a secure connection.     I need to obtain your verbal consent now.   Are you willing to proceed with your visit today? YES   Daisy [name] has provided verbal consent on 08/01/2020 for a virtual visit (video or telephone). Had to changed to telephone visit because could see patient but could not hear her and she could not hear me.   Daisy [name] Done, FNP   Date: 08/01/2020 1:56 PM   Virtual Visit via Video Note   I, Daisy Lesli Issa, connected with Daisy [name] ([id number], April 18, 1980) on 08/01/20 at  2:00 PM EDT by a video-enabled telemedicine application and verified that I am speaking with the correct person using two identifiers.  Location: Patient: Virtual Visit  Location Patient: Home Provider: Virtual Visit Location Provider: Mobile   I discussed the limitations of evaluation and management by telemedicine and the availability of in person appointments. The patient expressed understanding and agreed to proceed.    History of Present Illness: Daisy [name] is a 40 y.o. who identifies as a female who was assigned female at birth, and is being seen today for yeast infection.[name]  HPI: HPI  Patient has had a vaginal yeast infection for over a week. She has been using monistat OTC and it has not helped at all. She says she is really itching and feels raw down there. She denies discharge having an odor Problems:  Patient Active Problem List   Diagnosis Date Noted   Noncompliance with treatment plan 07/17/2020   Memory problem 06/05/2020   MDD (major depressive disorder), recurrent episode, moderate ([place]) 04/12/2019   Bereavement 04/12/2019   Hypertension 11/09/2018   MDD (major depressive disorder), recurrent, severe, with psychosis ([place]) 11/09/2018   Social anxiety disorder 11/09/2018   High risk medication use 11/09/2018   Recurrent major depressive disorder, in partial remission ([place]) 01/05/2018   Migraine without aura and without status migrainosus, not intractable 03/19/2015   Essential hypertension, benign 12/02/2013   Migraine headache 12/02/2013   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 (Patient not taking: Reported on 07/17/2020), Disp: , Rfl:    amLODipine (NORVASC) 5 MG tablet, ,  Disp: , Rfl:    azithromycin (ZITHROMAX) 250 MG tablet, Take 250 mg by mouth as directed. (Patient not taking: Reported on 07/17/2020), Disp: , Rfl:    [employer] STOOL SOFTENER 100 MG capsule, Take by mouth daily., Disp: , Rfl:    DULoxetine (CYMBALTA) 20 MG capsule, Take 2 capsules (40 mg total) by mouth daily., Disp: 60 capsule, Rfl: 1   fluconazole (DIFLUCAN) 100 MG tablet, Take by mouth. (Patient not taking: Reported on 07/17/2020), Disp: , Rfl:    fluticasone (FLONASE) 50 MCG/ACT nasal spray, Place 1 spray into both nostrils daily., Disp: , Rfl:    hydrOXYzine (ATARAX/VISTARIL) 25 MG tablet, TAKE 1 TABLET 2 (TWO) TIMES DAILY AS NEEDED FOR ANXIETY OR ITCHING. FOR ITCHING , SEVERE ANXIETY (Patient taking differently: Take 25 mg by mouth. Takes at bedtime as needed), Disp: 180 tablet, Rfl: 1   Multiple Vitamins-Minerals (MULTIVITAMIN PO), Take 1 tablet by mouth daily., Disp: , Rfl:    Naproxen Sodium 220 MG CAPS, Take 1 capsule by mouth as needed (headaches or cramping)., Disp: , Rfl:    Olopatadine HCl 0.2 % SOLN, Apply 1 drop to eye 2 (two) times daily., Disp: , Rfl:    Vitamin D, Ergocalciferol, (DRISDOL) 1.25 MG (50000 UNIT) CAPS capsule, Take 50,000 Units by mouth once a week., Disp: , Rfl:   Observations/Objective: Patient is well-developed, well-nourished in no acute distress.  Resting comfortably at home.  Head is normocephalic, atraumatic.  No labored breathing. Speech is clear and coherent with logical content.  Patient is alert and oriented at baseline.   Assessment and Plan:  Daisy [name] in today with chief complaint of No chief complaint on file.   1. Vaginal candidiasis Meds ordered this encounter  Medications   fluconazole (DIFLUCAN) 150 MG tablet    Sig: Take 1 tablet (150 mg total) by mouth once for 1 dose.    Dispense:  1 tablet    Refill:  0    Order Specific Question:   Supervising Provider    Answer:   Sabra Heck, BRIAN [[id number]]   Avoid bubble baths Void after  intercourse.RTO prn    The above assessment and management plan was discussed with the patient. The patient verbalized understanding of and has agreed to the management plan. Patient is aware to call the clinic if symptoms persist or worsen. Patient is aware when to return to the clinic for a follow-up visit. Patient educated on when it is appropriate to go to the emergency department.   Daisy [name] Done, FNP    Follow Up Instructions: I discussed the assessment and treatment plan with the patient. The patient was provided an opportunity to ask questions and all were answered. The patient agreed with the plan and demonstrated an understanding of the instructions.  A copy of instructions were sent to the patient via MyChart.  The patient was advised to call back or seek an in-person evaluation if the symptoms worsen or if the  condition fails to improve as anticipated.  Time:  I spent 10 minutes with the patient via telehealth technology discussing the above problems/concerns.    Daisy [name] Done, FNP

## 2020-08-07 ENCOUNTER — Telehealth: Payer: Self-pay | Admitting: Psychiatry

## 2020-08-07 NOTE — Telephone Encounter (Signed)
I have completed the FMLA form.  Janett Billow CMA to fax it.

## 2020-08-13 DIAGNOSIS — N939 Abnormal uterine and vaginal bleeding, unspecified: Secondary | ICD-10-CM | POA: Insufficient documentation

## 2020-08-13 DIAGNOSIS — R102 Pelvic and perineal pain: Secondary | ICD-10-CM | POA: Insufficient documentation

## 2020-08-13 DIAGNOSIS — N946 Dysmenorrhea, unspecified: Secondary | ICD-10-CM | POA: Insufficient documentation

## 2020-08-14 ENCOUNTER — Ambulatory Visit (INDEPENDENT_AMBULATORY_CARE_PROVIDER_SITE_OTHER): Payer: No Typology Code available for payment source | Admitting: Psychiatry

## 2020-08-14 ENCOUNTER — Encounter: Payer: Self-pay | Admitting: Psychiatry

## 2020-08-14 ENCOUNTER — Other Ambulatory Visit: Payer: Self-pay | Admitting: Psychiatry

## 2020-08-14 ENCOUNTER — Other Ambulatory Visit: Payer: Self-pay

## 2020-08-14 VITALS — BP 129/89 | HR 103 | Temp 97.9°F | Ht 67.0 in | Wt 182.4 lb

## 2020-08-14 DIAGNOSIS — F331 Major depressive disorder, recurrent, moderate: Secondary | ICD-10-CM

## 2020-08-14 DIAGNOSIS — R413 Other amnesia: Secondary | ICD-10-CM

## 2020-08-14 DIAGNOSIS — F4010 Social phobia, unspecified: Secondary | ICD-10-CM | POA: Diagnosis not present

## 2020-08-14 NOTE — Progress Notes (Signed)
[facility] MD OP Progress Note  08/14/2020 1:12 PM Daisy [name]  MRN:  [id number]  Chief Complaint:  Chief Complaint   Follow-up; Depression; Insomnia    HPI: Daisy [name] is a 40 year old female, currently on leave of absence from work, lives with  boyfriend, has a history of depression, social anxiety, hypertension, migraine headaches was evaluated in office today.  Patient today reports she is currently in individual therapy with her therapist  Daisy [name] .  She reports they are still in the initial phase of the therapy and it is going okay.  Patient continues to have depressive symptoms and reports sadness, concentration problems, low motivation.  She also struggles with sleep.  She reports she is able to sleep 2 to 3 nights a week.  Most of the time sleep is restless and she wakes up several times.  The hydroxyzine at this dosage help to some extent and helps her to sleep for 4 to 5 hours.  Denies side effects other than dry mouth.  Patient also reports she continues to struggle with memory changes, difficulty functioning at work and at home due to the same.  Memory changes likely due to her mood symptoms.  She did have a TSH labs as well as vitamin B12 completed.  Patient is compliant on the Cymbalta.  Denies side effects.  Patient denies any suicidality, homicidality or perceptual disturbances.  Patient reports she is not sure if she is ready to return to work yet and would like another 2-week extension of her medical leave of absence.  Patient also reports she is currently having uterine bleeding which is more than what it used to be during her menstrual period.  Patient reports she does have upcoming appointment with OB/GYN tomorrow.  Patient denies any other concerns today.  Visit Diagnosis:    ICD-10-CM   1. MDD (major depressive disorder), recurrent episode, moderate (HCC)  F33.1     2. Social anxiety disorder  F40.10     3. Memory problem  R41.3       Past Psychiatric  History: Reviewed past psychiatric history from progress note on 11/26/2018.  Past trials of medications- Prozac Lexapro Wellbutrin Elavil Seroquel Klonopin  Past Medical History:  Past Medical History:  Diagnosis Date   Anxiety    Depression    Hypertension    Migraine     Past Surgical History:  Procedure Laterality Date   NO PAST SURGERIES      Family Psychiatric History: Reviewed family psychiatric history from progress note on 11/26/2018  Family History:  Family History  Problem Relation Age of Onset   Alcohol abuse Maternal Aunt    Depression Maternal Aunt    Drug abuse Maternal Uncle    Alcohol abuse Maternal Grandmother     Social History: Reviewed social history from progress note on 11/26/2018 Social History   Socioeconomic History   Marital status: Single    Spouse name: Not on file   Number of children: 1   Years of education: Not on file   Highest education level: Not on file  Occupational History   Not on file  Tobacco Use   Smoking status: Never   Smokeless tobacco: Never  Substance and Sexual Activity   Alcohol use: Yes    Comment: occasionally   Drug use: No   Sexual activity: Not on file  Other Topics Concern   Not on file  Social History Narrative   Not on file   Social Determinants of Health  Financial Resource Strain: Not on file  Food Insecurity: Not on file  Transportation Needs: Not on file  Physical Activity: Not on file  Stress: Not on file  Social Connections: Not on file    Allergies:  Allergies  Allergen Reactions   Atenolol     Hair loss and skin eruption Hair loss and skin eruption Hair loss and skin eruption Hair loss and skin eruption Hair loss and skin eruption    Cephalexin Diarrhea    Metabolic Disorder Labs: No results found for: HGBA1C, MPG No results found for: PROLACTIN Lab Results  Component Value Date   CHOL 189 12/02/2013   TRIG 57.0 12/02/2013   HDL 46.90 12/02/2013   CHOLHDL 4  12/02/2013   VLDL 11.4 12/02/2013   LDLCALC 131 (H) 12/02/2013   Lab Results  Component Value Date   TSH 2.570 07/18/2020   TSH 1.34 12/02/2013    Therapeutic Level Labs: No results found for: LITHIUM No results found for: VALPROATE No components found for:  CBMZ  Current Medications: Current Outpatient Medications  Medication Sig Dispense Refill   amLODipine (NORVASC) 5 MG tablet      [employer] STOOL SOFTENER 100 MG capsule Take by mouth daily.     DULoxetine (CYMBALTA) 20 MG capsule Take 2 capsules (40 mg total) by mouth daily. 60 capsule 1   fluticasone (FLONASE) 50 MCG/ACT nasal spray Place 1 spray into both nostrils daily.     hydrOXYzine (ATARAX/VISTARIL) 25 MG tablet TAKE 1 TABLET 2 (TWO) TIMES DAILY AS NEEDED FOR ANXIETY OR ITCHING. FOR ITCHING , SEVERE ANXIETY (Patient taking differently: Take 25 mg by mouth. Takes at bedtime as needed) 180 tablet 1   Multiple Vitamins-Minerals (MULTIVITAMIN PO) Take 1 tablet by mouth daily.     Naproxen Sodium 220 MG CAPS Take 1 capsule by mouth as needed (headaches or cramping).     Vitamin D, Ergocalciferol, (DRISDOL) 1.25 MG (50000 UNIT) CAPS capsule Take 50,000 Units by mouth once a week.     No current facility-administered medications for this visit.     Musculoskeletal: Strength & Muscle Tone: within normal limits Gait & Station: normal Patient leans: N/A  Psychiatric Specialty Exam: Review of Systems  Gastrointestinal:  Positive for abdominal pain.  Genitourinary:  Positive for menstrual problem.  Psychiatric/Behavioral:  Positive for decreased concentration, dysphoric mood and sleep disturbance.   All other systems reviewed and are negative.  Blood pressure 129/89, pulse (!) 103, temperature 97.9 F (36.6 C), temperature source Temporal, height 5\' 7"  (1.702 m), weight 182 lb 6.4 oz (82.7 kg).Body mass index is 28.57 kg/m.  General Appearance: Casual  Eye Contact:  Good  Speech:  Clear and Coherent  Volume:  Normal  Mood:   Depressed  Affect:  Congruent  Thought Process:  Goal Directed and Descriptions of Associations: Intact  Orientation:  Full (Time, Place, and Person)  Thought Content: WDL   Suicidal Thoughts:  No  Homicidal Thoughts:  No  Memory:  Immediate;   Fair Recent;   Fair Remote;   Fair  Judgement:  Fair  Insight:  Fair  Psychomotor Activity:  Normal  Concentration:  Concentration: Good and Attention Span: Good  Recall:  Good  Fund of Knowledge: Good  Language: Good  Akathisia:  No  Handed:  Right  AIMS (if indicated): done  Assets:  Communication Skills Desire for Improvement Housing Intimacy Social Support  ADL's:  Intact  Cognition: WNL  Sleep:  Poor   Screenings: GAD-7    Flowsheet  [place] Office Visit from 08/14/2020 in [facility] Video Visit from 06/05/2020 in [facility]  Total GAD-7 Score 16 14      PHQ2-9    [place] Visit from 08/14/2020 in [facility] Video Visit from 07/17/2020 in [facility] Counselor from 06/14/2020 in [facility] Video Visit from 06/05/2020 in [facility] Video Visit from 03/07/2020 in [facility]  PHQ-2 Total Score 4 5 6 5 4   PHQ-9 Total Score 11 14 26 21 15       [place] Office Visit from 08/14/2020 in [facility] Counselor from 06/19/2020 in [facility] Counselor from 06/14/2020 in [facility] No Risk Low Risk Error: Question 6 not populated        Assessment and Plan: Viona Hosking is a 40 year old African-American female, employed, lives in [place], has a history of depression, anxiety, bereavement, hypertension, migraine headache was evaluated in office today.  Patient with multiple psychosocial stressors including death of her mother,  relationship struggles, work-related problems continues to struggle with depressive symptoms and will benefit from the following plan.  Plan MDD-unstable We will consider increasing Cymbalta to 60 mg p.o. daily however patient does have upcoming OB/GYN appointment for her uterine bleeding tomorrow.  Will reassess once patient completes her evaluation with OB/GYN. Until then she could continue Cymbalta 40 mg p.o. daily Discussed with patient to increase the hydroxyzine to 50 mg p.o. nightly as needed for sleep. Continue individual therapy with Daisy [name] in [place]. Phone [phone].  Social anxiety disorder-unstable Continue psychotherapy sessions Continue hydroxyzine 25 mg as needed for anxiety. We will consider increasing Cymbalta to 60 mg p.o. daily.  Memory problems-unstable Patient was referred for neuropsychological testing-has upcoming appointment. Reviewed and discussed TSH-dated 07/18/2020-within normal limits, vitamin B12-830-within normal limits.  Attempted to connect with patient's therapist-Daisy [name] -left voicemail.  I have advised patient to sign a release to obtain medical records from her OB/GYN.  Follow-up in clinic in 4 weeks or sooner in office.  I have spent atleast 30 minutes face to face with patient today which includes the time spent for preparing to see the patient ( e.g., review of test, records ), obtaining and to review and separately obtained history , ordering medications and test ,psychoeducation and supportive psychotherapy and care coordination,as well as documenting clinical information in electronic health record,interpreting and communication of test results   This note was generated in part or whole with voice recognition software. Voice recognition is usually quite accurate but there are transcription errors that can and very often do occur. I apologize for any typographical errors that were not detected and  corrected.       Daisy Alert, MD 08/14/2020, 1:12 PM

## 2020-08-16 MED ORDER — DULOXETINE HCL 60 MG PO CPEP: 60.0000 mg | ORAL_CAPSULE | Freq: Every day | ORAL | 1 refills | Status: DC

## 2020-08-16 NOTE — Telephone Encounter (Signed)
I have sent Cymbalta 60 mg p.o. daily to pharmacy.

## 2020-08-16 NOTE — Telephone Encounter (Signed)
pt called left message that she needs the new dosage for the cymbalta 60mg  sent to pharmacy  and  on her FLMA form it was done wrong.  she wants to be out consistanly. not interment.

## 2020-08-28 ENCOUNTER — Telehealth: Payer: Self-pay

## 2020-08-28 DIAGNOSIS — F331 Major depressive disorder, recurrent, moderate: Secondary | ICD-10-CM

## 2020-08-28 DIAGNOSIS — F4010 Social phobia, unspecified: Secondary | ICD-10-CM

## 2020-08-28 MED ORDER — DULOXETINE HCL 60 MG PO CPEP: 60.0000 mg | ORAL_CAPSULE | Freq: Every day | ORAL | 0 refills | Status: DC

## 2020-08-28 NOTE — Telephone Encounter (Signed)
received fax for a 90 day supply on the duloxetine

## 2020-08-28 NOTE — Telephone Encounter (Signed)
I have sent duloxetine to pharmacy.

## 2020-09-04 ENCOUNTER — Other Ambulatory Visit: Payer: Self-pay

## 2020-09-04 ENCOUNTER — Ambulatory Visit (HOSPITAL_COMMUNITY): Payer: No Typology Code available for payment source | Admitting: Psychiatry

## 2020-09-04 DIAGNOSIS — F331 Major depressive disorder, recurrent, moderate: Secondary | ICD-10-CM

## 2020-09-05 ENCOUNTER — Encounter (HOSPITAL_COMMUNITY): Payer: Self-pay | Admitting: Psychiatry

## 2020-09-05 NOTE — Progress Notes (Signed)
Group member contacted stating issues with being able to join group at set time. No current needs noted.   Romelle Starcher Monique Gift LCSW

## 2020-09-17 ENCOUNTER — Telehealth (INDEPENDENT_AMBULATORY_CARE_PROVIDER_SITE_OTHER): Payer: Self-pay | Admitting: Psychiatry

## 2020-09-17 ENCOUNTER — Other Ambulatory Visit: Payer: Self-pay

## 2020-09-17 DIAGNOSIS — F331 Major depressive disorder, recurrent, moderate: Secondary | ICD-10-CM

## 2020-09-17 NOTE — Progress Notes (Signed)
Patient wanted to do the evaluation while driving, since she had to go for another appointment. Advised that we cannot do that due to safety reasons.

## 2020-10-03 ENCOUNTER — Telehealth (INDEPENDENT_AMBULATORY_CARE_PROVIDER_SITE_OTHER): Payer: No Typology Code available for payment source | Admitting: Psychiatry

## 2020-10-03 ENCOUNTER — Other Ambulatory Visit: Payer: Self-pay

## 2020-10-03 ENCOUNTER — Encounter: Payer: Self-pay | Admitting: Psychiatry

## 2020-10-03 DIAGNOSIS — F4010 Social phobia, unspecified: Secondary | ICD-10-CM | POA: Diagnosis not present

## 2020-10-03 DIAGNOSIS — F3341 Major depressive disorder, recurrent, in partial remission: Secondary | ICD-10-CM | POA: Diagnosis not present

## 2020-10-03 DIAGNOSIS — R413 Other amnesia: Secondary | ICD-10-CM

## 2020-10-03 NOTE — Progress Notes (Signed)
Virtual Visit via Video Note  I connected with [name] on 10/03/20 at  9:30 AM EDT by a video enabled telemedicine application and verified that I am speaking with the correct person using two identifiers.  Location Provider Location : [facility] Patient Location : Home  Participants: Patient , Provider   I discussed the limitations of evaluation and management by telemedicine and the availability of in person appointments. The patient expressed understanding and agreed to proceed.    I discussed the assessment and treatment plan with the patient. The patient was provided an opportunity to ask questions and all were answered. The patient agreed with the plan and demonstrated an understanding of the instructions.   The patient was advised to call back or seek an in-person evaluation if the symptoms worsen or if the condition fails to improve as anticipated.   [facility] MD OP Progress Note  10/03/2020 10:01 AM [place]  MRN:  [id number]  Chief Complaint:  Chief Complaint   Follow-up; Depression; Anxiety    HPI: Yaira Bernardi is a 40 year old female, currently employed, lives with her boyfriend in [place], has a history of MDD, social anxiety disorder, memory problems, was evaluated by telemedicine today.  Patient reports her mood symptoms as improving.  She reports she does not feel as depressed as she used to before.  She is able to take care of herself, spend time with her family and is currently back at work.  Patient reports appetite is fair.  She reports the hydroxyzine helps her to sleep.  These are all positive changes.  She reports she has not had any significant problems with social anxiety recently, has been getting out more.  Patient continues to have memory problems on and off although she is managing okay at work.  Patient does struggle with her concentration.  She does have neuropsychological testing scheduled.  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  She enjoyed that work more than this one.  Patient denies any suicidality, homicidality or perceptual disturbances.  She is compliant on her Cymbalta, denies side effects.  Patient does report menstrual problems, was diagnosed with uterine fibroid per OB/GYN.  She does have upcoming appointment scheduled.  Patient reports she has not been compliant with CBT with her therapist due to financial reasons.  She however is interested in continuing CBT.  She agrees to give her a call.  Patient denies any other concerns today.    Visit Diagnosis:    ICD-10-CM   1. MDD (major depressive disorder), recurrent, in partial remission ([place])  F33.41     2. Social anxiety disorder  F40.10     3. Memory problem  R41.3       Past Psychiatric History: Reviewed past psychiatric history from progress note on 11/26/2018.  Past trials of medications- Prozac Lexapro Wellbutrin Elavil Seroquel Klonopin  Past Medical History:  Past Medical History:  Diagnosis Date   Anxiety    Depression    Hypertension    Migraine     Past Surgical History:  Procedure Laterality Date   NO PAST SURGERIES      Family Psychiatric History: Reviewed family psychiatric history from progress note on 11/26/2018  Family History:  Family History  Problem Relation Age of Onset   Alcohol abuse Maternal Aunt    Depression Maternal Aunt    Drug abuse Maternal Uncle    Alcohol abuse Maternal Grandmother     Social History: Reviewed social  history from progress note on 11/26/2018 Social History   Socioeconomic History   Marital status: Single    Spouse name: Not on file   Number of children: 1   Years of education: Not on file   Highest education level: Not on file  Occupational History   Not on file  Tobacco Use   Smoking status: Never   Smokeless tobacco: Never  Substance and Sexual Activity   Alcohol use: Yes    Comment: occasionally   Drug use: No   Sexual  activity: Not on file  Other Topics Concern   Not on file  Social History Narrative   Not on file   Social Determinants of Health   Financial Resource Strain: Not on file  Food Insecurity: Not on file  Transportation Needs: Not on file  Physical Activity: Not on file  Stress: Not on file  Social Connections: Not on file    Allergies:  Allergies  Allergen Reactions   Atenolol     Hair loss and skin eruption Hair loss and skin eruption Hair loss and skin eruption Hair loss and skin eruption Hair loss and skin eruption    Latex Itching   Cephalexin Diarrhea    Metabolic Disorder Labs: No results found for: HGBA1C, MPG No results found for: PROLACTIN Lab Results  Component Value Date   CHOL 189 12/02/2013   TRIG 57.0 12/02/2013   HDL 46.90 12/02/2013   CHOLHDL 4 12/02/2013   VLDL 11.4 12/02/2013   LDLCALC 131 (H) 12/02/2013   Lab Results  Component Value Date   TSH 2.570 07/18/2020   TSH 1.34 12/02/2013    Therapeutic Level Labs: No results found for: LITHIUM No results found for: VALPROATE No components found for:  CBMZ  Current Medications: Current Outpatient Medications  Medication Sig Dispense Refill   amLODipine (NORVASC) 5 MG tablet      [employer] STOOL SOFTENER 100 MG capsule Take by mouth daily.     DULoxetine (CYMBALTA) 60 MG capsule Take 1 capsule (60 mg total) by mouth daily. 90 capsule 0   fluticasone (FLONASE) 50 MCG/ACT nasal spray Place 1 spray into both nostrils daily.     hydrOXYzine (ATARAX/VISTARIL) 25 MG tablet TAKE 1 TABLET 2 (TWO) TIMES DAILY AS NEEDED FOR ANXIETY OR ITCHING. FOR ITCHING , SEVERE ANXIETY (Patient taking differently: Take 25 mg by mouth. Takes at bedtime as needed) 180 tablet 1   Multiple Vitamins-Minerals (MULTIVITAMIN PO) Take 1 tablet by mouth daily.     Naproxen Sodium 220 MG CAPS Take 1 capsule by mouth as needed (headaches or cramping).     QUICKVUE AT-HOME COVID-19 TEST KIT FOLLOW INSTRUCTIONS INCLUDED WITH THE PACKAGE.      Vitamin D, Ergocalciferol, (DRISDOL) 1.25 MG (50000 UNIT) CAPS capsule Take 50,000 Units by mouth once a week.     No current facility-administered medications for this visit.     Musculoskeletal: Strength & Muscle Tone:  UTA Gait & Station:  UTA Patient leans: N/A  Psychiatric Specialty Exam: Review of Systems  Genitourinary:  Positive for menstrual problem.  Psychiatric/Behavioral:  The patient is nervous/anxious.   All other systems reviewed and are negative.  There were no vitals taken for this visit.There is no height or weight on file to calculate BMI.  General Appearance: Casual  Eye Contact:  Fair  Speech:  Clear and Coherent  Volume:  Normal  Mood:  Anxious  Affect:  Congruent  Thought Process:  Goal Directed and Descriptions of Associations: Intact  Orientation:  Full (Time, Place, and Person)  Thought Content: Logical   Suicidal Thoughts:  No  Homicidal Thoughts:  No  Memory:  Immediate;   Fair Recent;   Fair Remote;   Fair  Judgement:  Fair  Insight:  Fair  Psychomotor Activity:  Normal  Concentration:  Concentration: Fair and Attention Span: Fair  Recall:  [employer] of Knowledge: Fair  Language: Fair  Akathisia:  No  Handed:  Right  AIMS (if indicated): done  Assets:  Communication Skills Desire for Improvement Housing Social Support  ADL's:  Intact  Cognition: WNL  Sleep:  Fair   Screenings: GAD-7    Flowsheet [place] Video Visit from 10/03/2020 in [facility] Office Visit from 08/14/2020 in [facility] Video Visit from 06/05/2020 in [facility]  Total GAD-7 Score _0 PHQ2-9    [place] Video Visit from 10/03/2020 in [facility] from 08/14/2020 in [facility] Video Visit from 07/17/2020 in [facility] Counselor from 06/14/2020 in [facility] Video Visit from 06/05/2020 in [facility]  PHQ-2 Total Score _1 PHQ-9 Total Score _2 [facility] [place] Office Visit from 08/14/2020 in [facility] Counselor from 06/19/2020 in [facility] from 06/14/2020 in [facility] No Risk Low Risk Error: Question 6 not populated        Assessment and Plan: Lilianne Delair is a 40 year old African-American female, employed, lives in [place], has a history of depression, bereavement, hypertension, migraine headaches, uterine fibroids, menstrual irregularity, was evaluated by telemedicine today.  Patient is currently improving on the current medication regimen with regards to her mood.  Plan as noted below.  Plan MDD in partial remission Continue Cymbalta 60 mg p.o. daily Continue hydroxyzine as prescribed, 25 to 50 mg as needed at bedtime for sleep Continue CBT with Ms.Kadia Warren-encouraged compliance AIMS - 0  Social anxiety disorder-improving Continue CBT Hydroxyzine 25 mg as needed for anxiety attacks Cymbalta 60 mg p.o. daily  Memory problems-unstable Patient does have upcoming neuropsychological testing scheduled We will coordinate care.  Patient to continue to follow up with OB/GYN for management of her menstrual problems.  Follow-up in clinic in 2 months or sooner if needed.  This note was generated in part or whole with voice recognition software. Voice recognition is usually quite accurate but there are transcription errors that can and very often do occur. I apologize for any typographical errors that were not detected and corrected.       Ursula Alert, MD 10/03/2020, 10:01 AM

## 2020-12-05 ENCOUNTER — Telehealth (INDEPENDENT_AMBULATORY_CARE_PROVIDER_SITE_OTHER): Payer: Self-pay | Admitting: Psychiatry

## 2020-12-05 ENCOUNTER — Other Ambulatory Visit: Payer: Self-pay

## 2020-12-05 DIAGNOSIS — Z91199 Patient's noncompliance with other medical treatment and regimen due to unspecified reason: Secondary | ICD-10-CM

## 2020-12-05 NOTE — Progress Notes (Signed)
No response to call or text or video invite  

## 2020-12-31 ENCOUNTER — Ambulatory Visit: Payer: Self-pay | Admitting: Psychology

## 2021-12-18 ENCOUNTER — Emergency Department (HOSPITAL_BASED_OUTPATIENT_CLINIC_OR_DEPARTMENT_OTHER)
Admission: EM | Admit: 2021-12-18 | Discharge: 2021-12-18 | Disposition: A | Discharge status: Home / Self Care | Payer: Medicaid Other | Attending: Emergency Medicine | Admitting: Emergency Medicine

## 2021-12-18 ENCOUNTER — Emergency Department (HOSPITAL_BASED_OUTPATIENT_CLINIC_OR_DEPARTMENT_OTHER): Payer: Medicaid Other

## 2021-12-18 ENCOUNTER — Encounter (HOSPITAL_BASED_OUTPATIENT_CLINIC_OR_DEPARTMENT_OTHER): Payer: Self-pay

## 2021-12-18 DIAGNOSIS — O161 Unspecified maternal hypertension, first trimester: Secondary | ICD-10-CM | POA: Diagnosis not present

## 2021-12-18 DIAGNOSIS — O200 Threatened abortion: Secondary | ICD-10-CM | POA: Insufficient documentation

## 2021-12-18 DIAGNOSIS — Z91040 Latex allergy status: Secondary | ICD-10-CM | POA: Diagnosis not present

## 2021-12-18 DIAGNOSIS — Z79899 Other long term (current) drug therapy: Secondary | ICD-10-CM | POA: Insufficient documentation

## 2021-12-18 DIAGNOSIS — Z3A00 Weeks of gestation of pregnancy not specified: Secondary | ICD-10-CM | POA: Insufficient documentation

## 2021-12-18 LAB — BASIC METABOLIC PANEL
Anion gap: 10 (ref 5–15)
BUN: 9 mg/dL (ref 6–20)
CO2: 26 mmol/L (ref 22–32)
Calcium: 9.5 mg/dL (ref 8.9–10.3)
Chloride: 103 mmol/L (ref 98–111)
Creatinine, Ser: 0.7 mg/dL (ref 0.44–1.00)
GFR, Estimated: >60 mL/min (ref >60)
Glucose, Bld: 99 mg/dL (ref 70–99)
Potassium: 3.4 mmol/L — ABNORMAL LOW (ref 3.5–5.1)
Sodium: 139 mmol/L (ref 135–145)

## 2021-12-18 LAB — CBC
HCT: 39.7 % (ref 36.0–46.0)
Hemoglobin: 13.1 g/dL (ref 12.0–15.0)
MCH: 26.8 pg (ref 26.0–34.0)
MCHC: 33 g/dL (ref 30.0–36.0)
MCV: 81.4 fL (ref 80.0–100.0)
Platelets: 232 10*3/uL (ref 150–400)
RBC: 4.88 MIL/uL (ref 3.87–5.11)
RDW: 14 % (ref 11.5–15.5)
WBC: 7.1 10*3/uL (ref 4.0–10.5)
nRBC: 0 % (ref 0.0–0.2)

## 2021-12-18 LAB — ABO/RH: ABO/RH(D): O POS

## 2021-12-18 LAB — HCG, QUANTITATIVE, PREGNANCY: hCG, Beta Chain, Quant, S: 79 m[IU]/mL — ABNORMAL HIGH (ref <5)

## 2021-12-18 MED ORDER — ACETAMINOPHEN 325 MG PO TABS
650.0000 mg | ORAL_TABLET | Freq: Once | ORAL | Status: AC
  Administered 2021-12-18: 650 mg via ORAL
  Filled 2021-12-18: qty 2

## 2021-12-18 MED ORDER — OXYCODONE-ACETAMINOPHEN 5-325 MG PO TABS
2.0000 | ORAL_TABLET | Freq: Once | ORAL | Status: DC
  Filled 2021-12-18: qty 2

## 2021-12-18 NOTE — Discharge Instructions (Addendum)
please go to the [facility]U Friday morning to have a repeat quantitative pregnancy test obtained to assure that it is decreasing.  The earlier in the day you get there less likely there is to be a wait.  Volume tends to pick up rapidly after 730 to 8 in the morning. Return for reevaluation if you are having worsening severe pain or severe bleeding.

## 2021-12-18 NOTE — ED Triage Notes (Signed)
Pt arrived POV from home c/o vaginal bleeding. Pt was found to be pregnant at [place] office for routine checkup at the health dept. LMP Nov 3, lasting 8 days. No birth control. Pt began spotting 3 days after the end of LMP.

## 2021-12-18 NOTE — ED Notes (Signed)
Brandi - RN aware of pt's BP.

## 2021-12-18 NOTE — ED Provider Notes (Signed)
[facility] EMERGENCY DEPT Provider Note   CSN: [id number] Arrival date & time: 12/18/21  8182     History  Chief Complaint  Patient presents with   Threatened Miscarriage    Daisy [name] is a 41 y.o. female.  D.W Dr. Elgie Congo   41 year old female history of hypertension, G2 P1 LMP November 4 3 November 11 with ongoing spotting since that time.  Patient states she was seen at health department in [facility] earlier this week.  She went there to get a refill of her blood pressure medication but states that they checked a pregnancy test and told her that she was pregnant.  She has had increased bleeding with some clots.  She has had some crampy suprapubic pain.  She denies headache, chest pain, dyspnea, syncope, nausea, vomiting, diarrhea.  She has been sexually active with only 1 partner and denies any abnormal vaginal discharge prior to the bleeding or history of STIs.  She is not having any UTI symptoms      Home Medications Prior to Admission medications   Medication Sig Start Date End Date Taking? Authorizing Provider  amLODipine (NORVASC) 5 MG tablet  10/05/18   [provider]  [employer] STOOL SOFTENER 100 MG capsule Take by mouth daily. 02/24/20   [provider]  DULoxetine (CYMBALTA) 60 MG capsule Take 1 capsule (60 mg total) by mouth daily. 08/28/20   Ursula Alert, MD  fluticasone (FLONASE) 50 MCG/ACT nasal spray Place 1 spray into both nostrils daily. 07/20/19   [provider]  hydrOXYzine (ATARAX/VISTARIL) 25 MG tablet TAKE 1 TABLET 2 (TWO) TIMES DAILY AS NEEDED FOR ANXIETY OR ITCHING. FOR ITCHING , SEVERE ANXIETY Patient taking differently: Take 25 mg by mouth. Takes at bedtime as needed 07/21/19   Ursula Alert, MD  Multiple Vitamins-Minerals (MULTIVITAMIN PO) Take 1 tablet by mouth daily.    [provider]  Naproxen Sodium 220 MG CAPS Take 1 capsule by mouth as needed (headaches or cramping).    [provider]   QUICKVUE AT-HOME COVID-19 TEST KIT FOLLOW INSTRUCTIONS INCLUDED WITH THE PACKAGE. 09/04/20   [provider]  Vitamin D, Ergocalciferol, (DRISDOL) 1.25 MG (50000 UNIT) CAPS capsule Take 50,000 Units by mouth once a week. 10/10/19   [provider]      Allergies    Atenolol, Latex, and Cephalexin    Review of Systems   Review of Systems  Physical Exam Updated Vital Signs BP (!) 146/123   Pulse 97   Temp 98.8 F (37.1 C) (Oral)   Resp 16   Ht 1.702 m (_0 )   Wt 90.3 kg   LMP 11/29/2021 (Exact Date)   SpO2 94%   BMI 31.17 kg/m  Physical Exam Vitals and nursing note reviewed.  Constitutional:      Appearance: Normal appearance.  HENT:     Head: Normocephalic.     Right Ear: External ear normal.     Left Ear: External ear normal.     Nose: Nose normal.     Mouth/Throat:     Pharynx: Oropharynx is clear.  Eyes:     Extraocular Movements: Extraocular movements intact.     Pupils: Pupils are equal, round, and reactive to light.  Cardiovascular:     Rate and Rhythm: Normal rate and regular rhythm.     Pulses: Normal pulses.  Pulmonary:     Effort: Pulmonary effort is normal.     Breath sounds: Normal breath sounds.  Abdominal:  General: Abdomen is flat.     Palpations: Abdomen is soft.     Comments: Mild suprapubic tenderness to palpation  Genitourinary:    General: Normal vulva.     Comments: Genital exam reveals some bleeding with close os and mild enlargement and tenderness of uterus Musculoskeletal:        General: Normal range of motion.     Cervical back: Normal range of motion.  Skin:    General: Skin is warm and dry.     Capillary Refill: Capillary refill takes less than 2 seconds.  Neurological:     General: No focal deficit present.     Mental Status: She is alert. Mental status is at baseline.  Psychiatric:        Mood and Affect: Mood normal.     ED Results / Procedures / Treatments   Labs (all labs ordered are listed, but  only abnormal results are displayed) Labs Reviewed  BASIC METABOLIC PANEL - Abnormal; Notable for the following components:      Result Value   Potassium 3.4 (*)    All other components within normal limits  HCG, QUANTITATIVE, PREGNANCY - Abnormal; Notable for the following components:   hCG, Beta Chain, Quant, S 79 (*)    All other components within normal limits  CBC  ABO/RH    EKG None  Radiology US OB LESS THAN 14 WEEKS WITH OB TRANSVAGINAL  Result Date: 12/18/2021 CLINICAL DATA:  Pregnant, vaginal bleeding for 1 week EXAM: OBSTETRIC <14 WK ULTRASOUND TECHNIQUE: Transabdominal ultrasound was performed for evaluation of the gestation as well as the maternal uterus and adnexal regions. COMPARISON:  None Available. FINDINGS: Intrauterine gestational sac: None Yolk sac:  Not Visualized. Embryo:  Not Visualized. Cardiac Activity: Not Visualized. Heart Rate: Not visualized. Subchorionic hemorrhage:  None visualized. Maternal uterus/adnexae: Multiple uterine fibroids measuring up to 5.0 cm. IMPRESSION: 1. No candidate intrauterine gestational sac identified by ultrasound. Early pregnancy of uncertain location. Recommend ectopic precautions, serial beta hCG, and consideration of follow-up ultrasound in 7-14 days to assess for continued development and viability. 2.  Uterine fibroids. Electronically Signed   By: Delanna Ahmadi M.D.   On: 12/18/2021 13:25    Procedures Procedures    Medications Ordered in ED Medications  oxyCODONE-acetaminophen (PERCOCET/ROXICET) 5-325 MG per tablet 2 tablet (2 tablets Oral Not Given 12/18/21 1401)  acetaminophen (TYLENOL) tablet 650 mg (has no administration in time range)    ED Course/ Medical Decision Making/ A&P Clinical Course as of 12/18/21 1402  Wed Dec 18, 2021  1051 HCG, Beta Chain, Quant, S(!): 79 [DR]  1051 Quantitative beta-hCG reviewed interpreted and is elevated at 79 [DR]  1051 CBC CBC reviewed interpreted within normal limits [DR]   4235 Basic metabolic panel(!) Metabolic panel reviewed interpreted significant for mild hypokalemia at 3.4 [DR]    Clinical Course User Index [DR] Pattricia Boss, MD                           Medical Decision Making 41 year old female presents today complaining of vaginal bleeding and some pain.  She reports positive pregnancy test recently Differential diagnosis includes ectopic pregnancy, miscarriage, Patient with positive pregnancy test quantitative 79 Hemodynamically stable with normal hemoglobin Discussed with Dr. Elgie Congo and with Gailen Shelter  at [facility]. Plan patient to return Friday morning for repeat quantitative pregnancy test. I have discussed with the patient that return precautions including increased pain, severe bleeding, lightheadedness, chest pain,  or other new symptoms.  We have discussed the need for follow-up and how she will follow-up.  Amount and/or Complexity of Data Reviewed Labs: ordered. Decision-making details documented in ED Course. Radiology: ordered.  Risk Prescription drug management.           Final Clinical Impression(s) / ED Diagnoses Final diagnoses:  Threatened abortion    Rx / DC Orders ED Discharge Orders     None         Pattricia Boss, MD 12/18/21 1402

## 2021-12-18 NOTE — ED Notes (Signed)
IV removed from pt's left AC, per Brandi - RN. IV site was clean, dry, and intact.

## 2021-12-20 ENCOUNTER — Inpatient Hospital Stay (HOSPITAL_COMMUNITY): Payer: Medicaid Other

## 2021-12-21 ENCOUNTER — Ambulatory Visit
Admission: RE | Admit: 2021-12-21 | Discharge: 2021-12-21 | Discharge status: Against Medical Advice | Payer: Medicaid Other | Source: Ambulatory Visit | Attending: Family Medicine | Admitting: Family Medicine

## 2021-12-23 ENCOUNTER — Telehealth: Payer: Self-pay

## 2021-12-23 NOTE — Telephone Encounter (Signed)
Called pt to confirm appointment for tomorrow. Left message for pt to call and confirm appointment by today

## 2021-12-24 ENCOUNTER — Ambulatory Visit (INDEPENDENT_AMBULATORY_CARE_PROVIDER_SITE_OTHER): Payer: Medicaid Other | Admitting: Obstetrics & Gynecology

## 2021-12-24 ENCOUNTER — Encounter: Payer: Self-pay | Admitting: Obstetrics & Gynecology

## 2021-12-24 VITALS — BP 130/88 | HR 79 | Ht 67.0 in | Wt 198.0 lb

## 2021-12-24 DIAGNOSIS — O209 Hemorrhage in early pregnancy, unspecified: Secondary | ICD-10-CM | POA: Diagnosis not present

## 2021-12-24 DIAGNOSIS — O3680X0 Pregnancy with inconclusive fetal viability, not applicable or unspecified: Secondary | ICD-10-CM

## 2021-12-24 NOTE — Progress Notes (Signed)
OFFICE VISIT NOTE  History:   Daisy [name] is a 41 y.o. [id number] here today for follow up of bleeding in early pregnancy, and pregnancy of unknown location.  Had positive pregnancy test at [facility] on 12/16/21, and then had heavy vaginal bleeding with abdominal pain on 12/18/21 for which she went to [facility] ER.    U/S did not show any pregnancy, HCG was 79, blood type O pos, Hgb 13.1.  She was told to follow up with OB/GYN.  Today, she reports reduced bleeding, but has had bleeding since visit.  No abdominal pain. No lightheadedness or syncopal symptoms.   No other concerns.  This was an unplanned pregnancy.    Past Medical History:  Diagnosis Date   Anxiety    Depression    Hypertension    Migraine     Past Surgical History:  Procedure Laterality Date   NO PAST SURGERIES      The following portions of the patient's history were reviewed and updated as appropriate: allergies, current medications, past family history, past medical history, past social history, past surgical history and problem list.   Health Maintenance:  Normal pap and negative HRHPV on 11/05/2019.  Never had a mammogram.  Review of Systems:  Pertinent items noted in HPI and remainder of comprehensive ROS otherwise negative.  Physical Exam:  BP 130/88   Pulse 79   Ht '5\' 7"'$  (1.702 m)   Wt 198 lb (89.8 kg)   LMP 11/29/2021 (Exact Date)   Breastfeeding Unknown   BMI 31.01 kg/m  CONSTITUTIONAL: Well-developed, well-nourished female in no acute distress.  HEENT:  Normocephalic, atraumatic. External right and left ear normal. No scleral icterus.  NECK: Normal range of motion, supple, no masses noted on observation SKIN: No rash noted. Not diaphoretic. No erythema. No pallor. MUSCULOSKELETAL: Normal range of motion. No edema noted. NEUROLOGIC: Alert and oriented to person, place, and time. Normal muscle tone coordination. No cranial nerve deficit noted. PSYCHIATRIC: Normal mood and affect. Normal  behavior. Normal judgment and thought content. CARDIOVASCULAR: Normal heart rate noted RESPIRATORY: Effort and breath sounds normal, no problems with respiration noted ABDOMEN: No masses noted. No other overt distention noted.   PELVIC: Deferred  Labs and Imaging Results for orders placed or performed during the hospital encounter of 12/18/21 (from the past 168 hour(s))  Basic metabolic panel   Collection Time: 12/18/21 10:03 AM  Result Value Ref Range   Sodium 139 135 - 145 mmol/L   Potassium 3.4 (L) 3.5 - 5.1 mmol/L   Chloride 103 98 - 111 mmol/L   CO2 26 22 - 32 mmol/L   Glucose, Bld 99 70 - 99 mg/dL   BUN 9 6 - 20 mg/dL   Creatinine, Ser 0.70 0.44 - 1.00 mg/dL   Calcium 9.5 8.9 - 10.3 mg/dL   GFR, Estimated >60 >60 mL/min   Anion gap 10 5 - 15  hCG, quantitative, pregnancy   Collection Time: 12/18/21 10:03 AM  Result Value Ref Range   hCG, Beta Chain, Quant, S 79 (H) <5 mIU/mL  CBC   Collection Time: 12/18/21 10:03 AM  Result Value Ref Range   WBC 7.1 4.0 - 10.5 K/uL   RBC 4.88 3.87 - 5.11 MIL/uL   Hemoglobin 13.1 12.0 - 15.0 g/dL   HCT 39.7 36.0 - 46.0 %   MCV 81.4 80.0 - 100.0 fL   MCH 26.8 26.0 - 34.0 pg   MCHC 33.0 30.0 - 36.0 g/dL   RDW  14.0 11.5 - 15.5 %   Platelets 232 150 - 400 K/uL   nRBC 0.0 0.0 - 0.2 %  ABO/Rh   Collection Time: 12/18/21 10:03 AM  Result Value Ref Range   ABO/RH(D) O POS    No rh immune globuloin      NOT A RH IMMUNE GLOBULIN CANDIDATE, PT RH POSITIVE Performed at [facility] [address], [place], [place] [address]    US OB LESS THAN 14 WEEKS WITH OB TRANSVAGINAL  Result Date: 12/18/2021 CLINICAL DATA:  Pregnant, vaginal bleeding for 1 week EXAM: OBSTETRIC <14 WK ULTRASOUND TECHNIQUE: Transabdominal ultrasound was performed for evaluation of the gestation as well as the maternal uterus and adnexal regions. COMPARISON:  None Available. FINDINGS: Intrauterine gestational sac: None Yolk sac:  Not Visualized. Embryo:  Not  Visualized. Cardiac Activity: Not Visualized. Heart Rate: Not visualized. Subchorionic hemorrhage:  None visualized. Maternal uterus/adnexae: Multiple uterine fibroids measuring up to 5.0 cm. IMPRESSION: 1. No candidate intrauterine gestational sac identified by ultrasound. Early pregnancy of uncertain location. Recommend ectopic precautions, serial beta hCG, and consideration of follow-up ultrasound in 7-14 days to assess for continued development and viability. 2.  Uterine fibroids. Electronically Signed   By: Delanna Ahmadi M.D.   On: 12/18/2021 13:25      Assessment and Plan:     1. Pregnancy of unknown anatomic location 2. Bleeding in early pregnancy Will check HCG and CBC today, will follow up results and manage accordingly. - Beta hCG quant (ref lab) - CBC Ectopic precautions reviewed with patient.  Return for Followup as recommended.    I spent 30 minutes dedicated to the care of this patient including pre-visit review of records, face to face time with the patient discussing her conditions and treatments and post visit orders.    Verita Schneiders, MD, [place] for [employer], [place]

## 2021-12-25 ENCOUNTER — Telehealth: Payer: Medicaid Other | Admitting: Physician Assistant

## 2021-12-25 DIAGNOSIS — Z760 Encounter for issue of repeat prescription: Secondary | ICD-10-CM

## 2021-12-25 LAB — CBC
Hematocrit: 38 % (ref 34.0–46.6)
Hemoglobin: 12.3 g/dL (ref 11.1–15.9)
MCH: 26.5 pg — ABNORMAL LOW (ref 26.6–33.0)
MCHC: 32.4 g/dL (ref 31.5–35.7)
MCV: 82 fL (ref 79–97)
Platelets: 265 10*3/uL (ref 150–450)
RBC: 4.65 x10E6/uL (ref 3.77–5.28)
RDW: 13.4 % (ref 11.7–15.4)
WBC: 4.9 10*3/uL (ref 3.4–10.8)

## 2021-12-25 LAB — BETA HCG QUANT (REF LAB): hCG Quant: 3 m[IU]/mL

## 2021-12-25 MED ORDER — AMLODIPINE BESYLATE 5 MG PO TABS: 5.0000 mg | ORAL_TABLET | Freq: Every day | ORAL | 0 refills | Status: DC

## 2021-12-25 NOTE — Progress Notes (Signed)
Virtual Visit Consent   [name], you are scheduled for a virtual visit with a [facility] provider today. Just as with appointments in the office, your consent must be obtained to participate. Your consent will be active for this visit and any virtual visit you may have with one of our providers in the next 365 days. If you have a MyChart account, a copy of this consent can be sent to you electronically.  As this is a virtual visit, video technology does not allow for your provider to perform a traditional examination. This may limit your provider's ability to fully assess your condition. If your provider identifies any concerns that need to be evaluated in person or the need to arrange testing (such as labs, EKG, etc.), we will make arrangements to do so. Although advances in technology are sophisticated, we cannot ensure that it will always work on either your end or our end. If the connection with a video visit is poor, the visit may have to be switched to a telephone visit. With either a video or telephone visit, we are not always able to ensure that we have a secure connection.  By engaging in this virtual visit, you consent to the provision of healthcare and authorize for your insurance to be billed (if applicable) for the services provided during this visit. Depending on your insurance coverage, you may receive a charge related to this service.  I need to obtain your verbal consent now. Are you willing to proceed with your visit today? Daisy [name] has provided verbal consent on 12/25/2021 for a virtual visit (video or telephone). Daisy [name], [place]  Date: 12/25/2021 2:14 PM  Virtual Visit via Video Note   I, Daisy [name], connected with  Erikah Thumm  ([id number], 1980-05-07) on 12/25/21 at  1:45 PM EST by a video-enabled telemedicine application and verified that I am speaking with the correct person using two identifiers.  Location: Patient: Virtual Visit  Location Patient: Home Provider: Virtual Visit Location Provider: Home Office   I discussed the limitations of evaluation and management by telemedicine and the availability of in person appointments. The patient expressed understanding and agreed to proceed.    History of Present Illness: Daisy [name] is a 41 y.o. who identifies as a female who was assigned female at birth, and is being seen today for refill of her amlodipine. Was on 5 mg of this for some time, with good control. Recently found out to have possible pregnancy so was taken off of it and switched to Labetalol with BP monitored by her OB. Unfortunately suffered a miscarriage recently, confirmed yesterday at [facility] office with [place] levels. Notes she was only switched to Labetalol due to pregnancy and home BP measurements have been in 485I-627O systolic at home with this. EMR recorded BP levels align with this. She is currently without PCP as her previous PCP at [facility] left and she has not been able to establish with a new provider. Is requesting refill of her Amlodipine until she can establish with a new PCP. Patient denies chest pain, palpitations, lightheadedness, dizziness, vision changes or frequent headaches.   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   Social anxiety disorder 11/09/2018   High risk medication use 11/09/2018   Recurrent major depressive disorder, in partial remission ([place]) 01/05/2018   Migraine without aura and without status migrainosus, not intractable 03/19/2015   Essential hypertension,  benign 12/02/2013   Migraine headache 12/02/2013   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 FOR ITCHING , SEVERE ANXIETY (Patient not taking: Reported on 12/24/2021), Disp: 180 tablet, Rfl: 1  Observations/Objective: Patient is well-developed, well-nourished in no acute distress.  Resting comfortably at home.  Head is normocephalic, atraumatic.  No labored breathing. Speech is clear and coherent with logical content.  Patient is alert and oriented at baseline.   Assessment and Plan: 1. Medication refill  Feel reasonable to refill her amlodipine to resume instead of labetalol as she is no longer pregnant and had taken this medication for quite some time with success and without ADR. Will give one-time refill of medication. She is aware that we do not do chronic disease management or repeat refills of medicine. Resources sent to help her establish with a new PCP. Also recommend home BP monitoring 3-4 x weekly over next 2 weeks and recording, following up with her OBGYN for manual BP recheck at that time and any further adjustments until she sees her new PCP. If they are unable to accommodate, she is to follow-up at in-person urgent care.   Follow Up Instructions: I discussed the assessment and treatment plan with the patient. The patient was provided an opportunity to ask questions and all were answered. The patient agreed with the plan and demonstrated an understanding of the instructions.  A copy of instructions were sent to the patient via MyChart  unless otherwise noted below.   The patient was advised to call back or seek an in-person evaluation if the symptoms worsen or if the condition fails to improve as anticipated.  Time:  I spent 10 minutes with the patient via telehealth technology discussing the above problems/concerns.    Daisy Rio, PA-C

## 2021-12-25 NOTE — Patient Instructions (Addendum)
Daisy [name], thank you for joining [name], PA-C for today's virtual visit.  While this provider is not your primary care provider (PCP), if your PCP is located in our provider database this encounter information will be shared with them immediately following your visit.   [facility] account gives you access to today's visit and all your visits, tests, and labs performed at [facility] " click here if you don't have a [facility] account or go to [email or web address]  Consent: (Patient) Daisy [name] provided verbal consent for this virtual visit at the beginning of the encounter.  Current Medications:  Current Outpatient Medications:    amLODipine (NORVASC) 5 MG tablet, , Disp: , Rfl:    [facility] STOOL SOFTENER 100 MG capsule, Take by mouth daily. (Patient not taking: Reported on 12/24/2021), Disp: , Rfl:    DULoxetine (CYMBALTA) 60 MG capsule, Take 1 capsule (60 mg total) by mouth daily. (Patient not taking: Reported on 12/24/2021), Disp: 90 capsule, Rfl: 0   fluticasone (FLONASE) 50 MCG/ACT nasal spray, Place 1 spray into both nostrils daily. (Patient not taking: Reported on 12/24/2021), Disp: , Rfl:    hydrOXYzine (ATARAX/VISTARIL) 25 MG tablet, TAKE 1 TABLET 2 (TWO) TIMES DAILY AS NEEDED FOR ANXIETY OR ITCHING. FOR ITCHING , SEVERE ANXIETY (Patient not taking: Reported on 12/24/2021), Disp: 180 tablet, Rfl: 1   Multiple Vitamins-Minerals (MULTIVITAMIN PO), Take 1 tablet by mouth daily. (Patient not taking: Reported on 12/24/2021), Disp: , Rfl:    Naproxen Sodium 220 MG CAPS, Take 1 capsule by mouth as needed (headaches or cramping). (Patient not taking: Reported on 12/24/2021), Disp: , Rfl:    QUICKVUE AT-HOME COVID-19 TEST KIT, FOLLOW INSTRUCTIONS INCLUDED WITH THE PACKAGE. (Patient not taking: Reported on 12/24/2021), Disp: , Rfl:    Vitamin D, Ergocalciferol, (DRISDOL) 1.25 MG (50000 UNIT) CAPS capsule, Take 50,000 Units by mouth once a  week. (Patient not taking: Reported on 12/24/2021), Disp: , Rfl:    Medications ordered in this encounter:  No orders of the defined types were placed in this encounter.    *If you need refills on other medications prior to your next appointment, please contact your pharmacy*  Follow-Up: Call back or seek an in-person evaluation if the symptoms worsen or if the condition fails to improve as anticipated.  [place] [phone]  Other Instructions Restart amlodipine 5 mg once daily instead of the labetalol. Continue to follow low-sodium diet (see below) Continue to check blood pressure measurements at home at least 3-4 times weekly over the next 2 weeks. Record your measurements.  Goal is for less than 130/80, preferably less than 125/75.  If blood pressure remains above goal after 2 weeks, I want you to follow-up with your OB/GYN who can recheck blood pressure manually and make adjustments until you get established with a new primary care provider.  Please see the links below to get scheduled with a new primary care provider.  If it anytime you notice chest pain, racing heart, shortness of breath, lightheadedness or dizziness, with or without elevated blood pressure, we want you to be evaluated at nearest emergency room ASAP.  Please do not delay care.  Again we can only provide a one-time refill.  If you are unable to get further refills from your OB/GYN or establish with a new primary care provider in that time, you will need to be seen in in person urgent care.  DASH Eating Plan DASH stands for Dietary Approaches to  Stop Hypertension. The DASH eating plan is a healthy eating plan that has been shown to: Reduce high blood pressure (hypertension). Reduce your risk for type 2 diabetes, heart disease, and stroke. Help with weight loss. What are tips for following this plan? Reading food labels Check food labels for the amount of salt (sodium) per serving. Choose foods  with less than 5 percent of the Daily Value of sodium. Generally, foods with less than 300 milligrams (mg) of sodium per serving fit into this eating plan. To find whole grains, look for the word "whole" as the first word in the ingredient list. Shopping Buy products labeled as "low-sodium" or "no salt added." Buy fresh foods. Avoid canned foods and pre-made or frozen meals. Cooking Avoid adding salt when cooking. Use salt-free seasonings or herbs instead of table salt or sea salt. Check with your health care provider or pharmacist before using salt substitutes. Do not fry foods. Cook foods using healthy methods such as baking, boiling, grilling, roasting, and broiling instead. Cook with heart-healthy oils, such as olive, canola, avocado, soybean, or sunflower oil. Meal planning  Eat a balanced diet that includes: 4 or more servings of fruits and 4 or more servings of vegetables each day. Try to fill one-half of your plate with fruits and vegetables. 6-8 servings of whole grains each day. Less than 6 oz (170 g) of lean meat, poultry, or fish each day. A 3-oz (85-g) serving of meat is about the same size as a deck of cards. One egg equals 1 oz (28 g). 2-3 servings of low-fat dairy each day. One serving is 1 cup (237 mL). 1 serving of nuts, seeds, or beans 5 times each week. 2-3 servings of heart-healthy fats. Healthy fats called omega-3 fatty acids are found in foods such as walnuts, flaxseeds, fortified milks, and eggs. These fats are also found in cold-water fish, such as sardines, salmon, and mackerel. Limit how much you eat of: Canned or prepackaged foods. Food that is high in trans fat, such as some fried foods. Food that is high in saturated fat, such as fatty meat. Desserts and other sweets, sugary drinks, and other foods with added sugar. Full-fat dairy products. Do not salt foods before eating. Do not eat more than 4 egg yolks a week. Try to eat at least 2 vegetarian meals a  week. Eat more home-cooked food and less restaurant, buffet, and fast food. Lifestyle When eating at a restaurant, ask that your food be prepared with less salt or no salt, if possible. If you drink alcohol: Limit how much you use to: 0-1 drink a day for women who are not pregnant. 0-2 drinks a day for men. Be aware of how much alcohol is in your drink. In the [place] one drink equals one 12 oz bottle of beer (355 mL), one 5 oz glass of wine (148 mL), or one 1 oz glass of hard liquor (44 mL). General information Avoid eating more than 2,300 mg of salt a day. If you have hypertension, you may need to reduce your sodium intake to 1,500 mg a day. Work with your health care provider to maintain a healthy body weight or to lose weight. Ask what an ideal weight is for you. Get at least 30 minutes of exercise that causes your heart to beat faster (aerobic exercise) most days of the week. Activities may include walking, swimming, or biking. Work with your health care provider or dietitian to adjust your eating plan to your individual calorie  needs. What foods should I eat? Fruits All fresh, dried, or frozen fruit. Canned fruit in natural juice (without added sugar). Vegetables Fresh or frozen vegetables (raw, steamed, roasted, or grilled). Low-sodium or reduced-sodium tomato and vegetable juice. Low-sodium or reduced-sodium tomato sauce and tomato paste. Low-sodium or reduced-sodium canned vegetables. Grains Whole-grain or whole-wheat bread. Whole-grain or whole-wheat pasta. Brown rice. [place]. Bulgur. Whole-grain and low-sodium cereals. Pita bread. Low-fat, low-sodium crackers. Whole-wheat flour tortillas. Meats and other proteins Skinless chicken or [place]. Ground chicken or [place]. Pork with fat trimmed off. Fish and seafood. Egg whites. Dried beans, peas, or lentils. Unsalted nuts, nut butters, and seeds. Unsalted canned beans. Lean cuts of beef with fat trimmed off. Low-sodium, lean  precooked or cured meat, such as sausages or meat loaves. Dairy Low-fat (1%) or fat-free (skim) milk. Reduced-fat, low-fat, or fat-free cheeses. Nonfat, low-sodium ricotta or cottage cheese. Low-fat or nonfat yogurt. Low-fat, low-sodium cheese. Fats and oils Soft margarine without trans fats. Vegetable oil. Reduced-fat, low-fat, or light mayonnaise and salad dressings (reduced-sodium). Canola, safflower, olive, avocado, soybean, and sunflower oils. Avocado. Seasonings and condiments Herbs. Spices. Seasoning mixes without salt. Other foods Unsalted popcorn and pretzels. Fat-free sweets. The items listed above may not be a complete list of foods and beverages you can eat. Contact a dietitian for more information. What foods should I avoid? Fruits Canned fruit in a light or heavy syrup. Fried fruit. Fruit in cream or butter sauce. Vegetables Creamed or fried vegetables. Vegetables in a cheese sauce. Regular canned vegetables (not low-sodium or reduced-sodium). Regular canned tomato sauce and paste (not low-sodium or reduced-sodium). Regular tomato and vegetable juice (not low-sodium or reduced-sodium). Angie Fava. Olives. Grains Baked goods made with fat, such as croissants, muffins, or some breads. Dry pasta or rice meal packs. Meats and other proteins Fatty cuts of meat. Ribs. Fried meat. Berniece Salines. Bologna, salami, and other precooked or cured meats, such as sausages or meat loaves. Fat from the back of a pig (fatback). Bratwurst. Salted nuts and seeds. Canned beans with added salt. Canned or smoked fish. Whole eggs or egg yolks. Chicken or [place] with skin. Dairy Whole or 2% milk, cream, and half-and-half. Whole or full-fat cream cheese. Whole-fat or sweetened yogurt. Full-fat cheese. Nondairy creamers. Whipped toppings. Processed cheese and cheese spreads. Fats and oils Butter. Stick margarine. Lard. Shortening. Ghee. Bacon fat. Tropical oils, such as coconut, palm kernel, or palm oil. Seasonings  and condiments Onion salt, garlic salt, seasoned salt, table salt, and sea salt. Worcestershire sauce. Tartar sauce. Barbecue sauce. Teriyaki sauce. Soy sauce, including reduced-sodium. Steak sauce. Canned and packaged gravies. Fish sauce. Oyster sauce. Cocktail sauce. Store-bought horseradish. Ketchup. Mustard. Meat flavorings and tenderizers. Bouillon cubes. Hot sauces. Pre-made or packaged marinades. Pre-made or packaged taco seasonings. Relishes. Regular salad dressings. Other foods Salted popcorn and pretzels. The items listed above may not be a complete list of foods and beverages you should avoid. Contact a dietitian for more information. Where to find more information National Heart, Lung, and Blood Institute: https://wilson-eaton.com/ American Heart Association: www.heart.org Academy of Nutrition and Dietetics: www.eatright.[place]: www.kidney.org Summary The DASH eating plan is a healthy eating plan that has been shown to reduce high blood pressure (hypertension). It may also reduce your risk for type 2 diabetes, heart disease, and stroke. When on the DASH eating plan, aim to eat more fresh fruits and vegetables, whole grains, lean proteins, low-fat dairy, and heart-healthy fats. With the DASH eating plan, you should limit salt (sodium) intake  to 2,300 mg a day. If you have hypertension, you may need to reduce your sodium intake to 1,500 mg a day. Work with your health care provider or dietitian to adjust your eating plan to your individual calorie needs. This information is not intended to replace advice given to you by your health care provider. Make sure you discuss any questions you have with your health care provider. Document Revised: 12/17/2018 Document Reviewed: 12/17/2018 Elsevier Patient Education  [place].    If you have been instructed to have an in-person evaluation today at a local Urgent Care facility, please use the link below. It will take you  to a list of all of our available [place] [facility], including address, phone number and hours of operation. Please do not delay care.  [facility]  If you or a family member do not have a primary care provider, use the link below to schedule a visit and establish care. When you choose a [facility] primary care physician or advanced practice provider, you gain a long-term partner in health. Find a Primary Care Provider  Learn more about [facility]'s in-office and virtual care options: [facility]

## 2021-12-30 ENCOUNTER — Ambulatory Visit: Payer: Medicaid Other | Admitting: Family

## 2022-01-09 ENCOUNTER — Encounter: Payer: Self-pay | Admitting: *Deleted

## 2022-01-28 ENCOUNTER — Encounter: Payer: Self-pay | Admitting: Obstetrics & Gynecology

## 2022-01-28 ENCOUNTER — Ambulatory Visit (INDEPENDENT_AMBULATORY_CARE_PROVIDER_SITE_OTHER): Payer: Medicaid Other | Admitting: Obstetrics & Gynecology

## 2022-01-28 VITALS — BP 128/86 | HR 80 | Wt 196.0 lb

## 2022-01-28 DIAGNOSIS — N920 Excessive and frequent menstruation with regular cycle: Secondary | ICD-10-CM | POA: Diagnosis not present

## 2022-01-28 DIAGNOSIS — D219 Benign neoplasm of connective and other soft tissue, unspecified: Secondary | ICD-10-CM

## 2022-01-28 DIAGNOSIS — Z30013 Encounter for initial prescription of injectable contraceptive: Secondary | ICD-10-CM | POA: Diagnosis not present

## 2022-01-28 LAB — POCT URINE PREGNANCY: Preg Test, Ur: NEGATIVE

## 2022-01-28 MED ORDER — MEDROXYPROGESTERONE ACETATE 150 MG/ML IM SUSP: 150.0000 mg | INTRAMUSCULAR | 3 refills | Status: DC

## 2022-01-28 MED ORDER — MEDROXYPROGESTERONE ACETATE 150 MG/ML IM SUSP
150.0000 mg | Freq: Once | INTRAMUSCULAR | Status: AC
  Administered 2022-01-28: 150 mg via INTRAMUSCULAR

## 2022-01-28 NOTE — Progress Notes (Signed)
GYNECOLOGY OFFICE VISIT NOTE  History:   Daisy [name] is a 42 y.o. [phone] here today for discussion about management of heavy menstrual periods and fibroids.  Has regular periods, last about 7 days, heavier on days 3-7 with clots and cramping.  Has known fibroids, largest 5 cm which cause a lot of pain during her periods.  Patient wants to discuss being on Depo Provera for this bleeding and for contraception, recently had a miscarriage and does not desire any future pregnancy   She denies any abnormal vaginal discharge, bleeding, pelvic pain or other concerns.    Past Medical History:  Diagnosis Date   Anxiety    Depression    Hypertension    Migraine     Past Surgical History:  Procedure Laterality Date   NO PAST SURGERIES      The following portions of the patient's history were reviewed and updated as appropriate: allergies, current medications, past family history, past medical history, past social history, past surgical history and problem list.   Health Maintenance:  Normal pap and negative HRHPV on 11/05/2019.   Review of Systems:  Pertinent items noted in HPI and remainder of comprehensive ROS otherwise negative.  Physical Exam:  BP 128/86   Pulse 80   Wt 196 lb (88.9 kg)   LMP 01/15/2022 (Exact Date)   BMI 30.70 kg/m  CONSTITUTIONAL: Well-developed, well-nourished female in no acute distress.  HEENT:  Normocephalic, atraumatic. External right and left ear normal. No scleral icterus.  NECK: Normal range of motion, supple, no masses noted on observation SKIN: No rash noted. Not diaphoretic. No erythema. No pallor. MUSCULOSKELETAL: Normal range of motion. No edema noted. NEUROLOGIC: Alert and oriented to person, place, and time. Normal muscle tone coordination. No cranial nerve deficit noted. PSYCHIATRIC: Normal mood and affect. Normal behavior. Normal judgment and thought content. CARDIOVASCULAR: Normal heart rate noted RESPIRATORY: Effort and breath sounds  normal, no problems with respiration noted ABDOMEN: No masses noted. No other overt distention noted.   PELVIC: Deferred  Labs and Imaging Results for orders placed or performed in visit on 01/28/22 (from the past 168 hour(s))  POCT urine pregnancy   Collection Time: 01/28/22 12:18 PM  Result Value Ref Range   Preg Test, Ur Negative Negative   No results found.    Assessment and Plan:     1. Initiation of Depo Provera 2. Fibroids 3. Menorrhagia with regular cycle Discussed management options for menorrhagia due to fibroids including NSAIDs (Naproxen), tranexamic acid (Lysteda), oral progesterone, Depo Provera, Levonogestrel IUD, Sonata, endometrial ablation or hysterectomy as definitive surgical management.  Discussed risks and benefits of each method.   Patient desires Depo Provera for now.  This was given and then prescribed for future doses,  bleeding precautions reviewed.  Of note, patient was counseled that there is a slight risk of pregnancy given her LMP of 01/15/22 and report of unprotected intercourse within the last 5 days.  She wanted to proceed with this injection today. She was told to repeat pregnancy test in 2-3 weeks.  - medroxyPROGESTERone (DEPO-PROVERA) injection 150 mg - POCT urine pregnancy - medroxyPROGESTERone (DEPO-PROVERA) 150 MG/ML injection; Inject 1 mL (150 mg total) into the muscle every 3 (three) months.  Dispense: 1 mL; Refill: 3  Routine preventative health maintenance measures emphasized. Please refer to After Visit Summary for other counseling recommendations.   Return for any gynecologic concerns.    I spent 30 minutes dedicated to the care of this patient including pre-visit review  of records, face to face time with the patient discussing her conditions and treatments and post visit orders.    Verita Schneiders, MD, [place] for [employer], [place]

## 2022-03-26 ENCOUNTER — Ambulatory Visit: Payer: Medicaid Other | Admitting: Medical-Surgical

## 2022-03-26 ENCOUNTER — Ambulatory Visit (INDEPENDENT_AMBULATORY_CARE_PROVIDER_SITE_OTHER): Payer: 59 | Admitting: Medical-Surgical

## 2022-03-26 ENCOUNTER — Encounter: Payer: Self-pay | Admitting: Medical-Surgical

## 2022-03-26 VITALS — BP 135/86 | HR 80 | Resp 20 | Ht 67.0 in | Wt 197.3 lb

## 2022-03-26 DIAGNOSIS — Z30019 Encounter for initial prescription of contraceptives, unspecified: Secondary | ICD-10-CM

## 2022-03-26 DIAGNOSIS — R1032 Left lower quadrant pain: Secondary | ICD-10-CM

## 2022-03-26 DIAGNOSIS — Z7689 Persons encountering health services in other specified circumstances: Secondary | ICD-10-CM

## 2022-03-26 DIAGNOSIS — I10 Essential (primary) hypertension: Secondary | ICD-10-CM

## 2022-03-26 LAB — CBC WITH DIFFERENTIAL/PLATELET
Absolute Monocytes: 429 cells/uL (ref 200–950)
Basophils Absolute: 41 cells/uL (ref 0–200)
Basophils Relative: 0.7 %
Eosinophils Absolute: 110 cells/uL (ref 15–500)
Eosinophils Relative: 1.9 %
HCT: 38.8 % (ref 35.0–45.0)
Hemoglobin: 12.5 g/dL (ref 11.7–15.5)
Lymphs Abs: 2198 cells/uL (ref 850–3900)
MCH: 26.8 pg — ABNORMAL LOW (ref 27.0–33.0)
MCHC: 32.2 g/dL (ref 32.0–36.0)
MCV: 83.1 fL (ref 80.0–100.0)
MPV: 10.9 fL (ref 7.5–12.5)
Monocytes Relative: 7.4 %
Neutro Abs: 3022 cells/uL (ref 1500–7800)
Neutrophils Relative %: 52.1 %
Platelets: 248 10*3/uL (ref 140–400)
RBC: 4.67 10*6/uL (ref 3.80–5.10)
RDW: 13 % (ref 11.0–15.0)
Total Lymphocyte: 37.9 %
WBC: 5.8 10*3/uL (ref 3.8–10.8)

## 2022-03-26 MED ORDER — AMLODIPINE BESYLATE 5 MG PO TABS: 5.0000 mg | ORAL_TABLET | Freq: Every day | ORAL | 1 refills | Status: DC

## 2022-03-26 NOTE — Progress Notes (Signed)
New Patient Office Visit  Subjective:  Patient ID: Daisy [name], female    DOB: [birth year]  Age: 42 y.o. MRN: [id number]  CC:  Chief Complaint  Patient presents with   Establish Care   HPI Daisy [name] presents to establish care. She is a very pleasant 42 year old female with a history of the following:  Mood: history of anxiety and depression treated with medication until she self discontinued about a year ago. Feels that she is doing well off the medication. Admits to some days where she experiences sadness or anxiety more than others but this is manageable. Denies SI/HI.  HTN: taking Amlodipine '5mg'$  daily, tolerating well without side effects. Has a BP cuff at home but has not been checking her BP regularly. Knows her BP is elevated because she will feel bad and a little bit light headed.   Migraine: has a history of migraines but has been doing well without frequent headaches recently. No currently taking medications for it but will use OTCs if needed.   Birth control: has been seeing OB/GYN and started Depo-Provera injections for menstrual management and birth control. Her first injection was in January and she reports that her bleeding has been spotty since then. Used the Depo-Provera many years ago and did well without it. Interested in getting her injection done in our office.   Digestion issues: reports that she eats fairly healthy and is cognizant to avoid certain foods. Unfortunately, has been having excessive gassiness, bloating and abdominal discomfort. Has trouble with constipation unless she takes an OTC stool softener. Has used Miralax in the past without relief. Recently had a flare of hemorrhoids that is still working on resolving. More recently, has developed LLQ pain and tenderness that seems to escalate throughout the day and can become unbearable at times. No melena or hematochezia. No nausea or vomiting.     Past Medical History:  Diagnosis Date   Anxiety     Depression    Hypertension    Migraine     Past Surgical History:  Procedure Laterality Date   NO PAST SURGERIES     Family History  Problem Relation Age of Onset   Alcohol abuse Maternal Aunt    Depression Maternal Aunt    Drug abuse Maternal Uncle        crack   Alcohol abuse Maternal Grandmother    Social History   Socioeconomic History   Marital status: Single    Spouse name: Not on file   Number of children: 1   Years of education: Not on file   Highest education level: Not on file  Occupational History   Not on file  Tobacco Use   Smoking status: Never   Smokeless tobacco: Never  Substance and Sexual Activity   Alcohol use: Yes    Comment: occasionally   Drug use: No   Sexual activity: Not on file  Other Topics Concern   Not on file  Social History Narrative   Not on file   Social Determinants of Health   Financial Resource Strain: Not on file  Food Insecurity: Not on file  Transportation Needs: Not on file  Physical Activity: Not on file  Stress: Not on file  Social Connections: Not on file  Intimate Partner Violence: Not on file   ROS Review of Systems  Constitutional:  Negative for chills, fatigue, fever and unexpected weight change.  HENT:  Negative for congestion, rhinorrhea, sinus pressure and sore throat.   Respiratory:  Negative for  cough, chest tightness and shortness of breath.   Cardiovascular:  Negative for chest pain, palpitations and leg swelling.  Gastrointestinal:  Positive for abdominal distention, abdominal pain and constipation. Negative for blood in stool, diarrhea, nausea and vomiting.  Endocrine: Negative for cold intolerance and heat intolerance.  Genitourinary:  Negative for dysuria, frequency, urgency, vaginal bleeding and vaginal discharge.  Skin:  Negative for rash and wound.  Neurological:  Negative for dizziness, light-headedness and headaches.  Hematological:  Does not bruise/bleed easily.  Psychiatric/Behavioral:   Negative for self-injury, sleep disturbance and suicidal ideas. The patient is not nervous/anxious.    Objective:   Today's Vitals: BP 135/86 (BP Location: Left Arm, Cuff Size: Normal)   Pulse 80   Resp 20   Ht '5\' 7"'$  (1.702 m)   Wt 197 lb 4.8 oz (89.5 kg)   SpO2 99%   BMI 30.90 kg/m   Physical Exam Vitals reviewed.  Constitutional:      General: She is not in acute distress.    Appearance: Normal appearance.  HENT:     Head: Normocephalic and atraumatic.  Cardiovascular:     Rate and Rhythm: Normal rate and regular rhythm.     Pulses: Normal pulses.     Heart sounds: Normal heart sounds. No murmur heard.    No friction rub. No gallop.  Pulmonary:     Effort: Pulmonary effort is normal. No respiratory distress.     Breath sounds: Normal breath sounds. No wheezing.  Abdominal:     General: Bowel sounds are normal. There is distension (lower quadrants).     Palpations: Abdomen is soft. There is no mass.     Tenderness: There is abdominal tenderness (LLQ). There is no guarding or rebound.     Hernia: No hernia is present.  Skin:    General: Skin is warm and dry.  Neurological:     Mental Status: She is alert and oriented to person, place, and time.  Psychiatric:        Mood and Affect: Mood normal.        Behavior: Behavior normal.        Thought Content: Thought content normal.        Judgment: Judgment normal.    Assessment & Plan:   1. Encounter to establish care Reviewed available information and discussed care concerns with patient.   2. Left lower quadrant abdominal pain Checking CBC w/diff. CT abdomen/pelvis for further investigation. Consider possible constipation vs diverticulitis as a cause for her LLQ pain. Continue stool softeners.  - CBC with Differential/Platelet - CT Abdomen Pelvis Wo Contrast; Future  3. Essential hypertension, benign BP stable. Continue Amlodipine '5mg'$  daily.  - amLODipine (NORVASC) 5 MG tablet; Take 1 tablet (5 mg total) by mouth  daily.  Dispense: 90 tablet; Refill: 1  4. Encounter for female birth control Ok to continue Depo-Provera injections every 12-14 weeks. Please schedule nurse visit when next injection is due.    Outpatient Encounter Medications as of 03/26/2022  Medication Sig   Bacillus Coagulans-Inulin (ALIGN PREBIOTIC-PROBIOTIC PO) Take by mouth.   medroxyPROGESTERone (DEPO-PROVERA) 150 MG/ML injection Inject 1 mL (150 mg total) into the muscle every 3 (three) months.   Multiple Vitamin (MULTIVITAMIN ADULT PO) Take by mouth.   [DISCONTINUED] amLODipine (NORVASC) 5 MG tablet Take 1 tablet (5 mg total) by mouth daily. (Patient taking differently: Take 25 mg by mouth daily.)   amLODipine (NORVASC) 5 MG tablet Take 1 tablet (5 mg total) by mouth daily.  No facility-administered encounter medications on file as of 03/26/2022.    Follow-up: Return in about 6 months (around 09/24/2022) for HTN follow up.   Clearnce Sorrel, DNP, APRN, FNP-BC [facility]

## 2022-03-31 ENCOUNTER — Other Ambulatory Visit: Payer: Self-pay | Admitting: Medical-Surgical

## 2022-03-31 ENCOUNTER — Ambulatory Visit (INDEPENDENT_AMBULATORY_CARE_PROVIDER_SITE_OTHER): Payer: 59

## 2022-03-31 DIAGNOSIS — N852 Hypertrophy of uterus: Secondary | ICD-10-CM

## 2022-03-31 DIAGNOSIS — R1032 Left lower quadrant pain: Secondary | ICD-10-CM

## 2022-04-25 ENCOUNTER — Other Ambulatory Visit: Payer: 59

## 2022-04-28 ENCOUNTER — Ambulatory Visit: Payer: 59

## 2022-05-02 ENCOUNTER — Other Ambulatory Visit: Payer: 59

## 2022-05-02 ENCOUNTER — Ambulatory Visit (INDEPENDENT_AMBULATORY_CARE_PROVIDER_SITE_OTHER): Payer: 59

## 2022-05-02 DIAGNOSIS — N852 Hypertrophy of uterus: Secondary | ICD-10-CM

## 2022-05-05 ENCOUNTER — Encounter: Payer: Self-pay | Admitting: Medical-Surgical

## 2022-05-05 DIAGNOSIS — R1032 Left lower quadrant pain: Secondary | ICD-10-CM

## 2022-05-05 DIAGNOSIS — N852 Hypertrophy of uterus: Secondary | ICD-10-CM

## 2022-05-29 ENCOUNTER — Ambulatory Visit (INDEPENDENT_AMBULATORY_CARE_PROVIDER_SITE_OTHER): Payer: 59 | Admitting: Obstetrics & Gynecology

## 2022-05-29 ENCOUNTER — Encounter: Payer: Self-pay | Admitting: Obstetrics & Gynecology

## 2022-05-29 VITALS — BP 130/88 | HR 77 | Ht 67.0 in | Wt 193.0 lb

## 2022-05-29 DIAGNOSIS — N920 Excessive and frequent menstruation with regular cycle: Secondary | ICD-10-CM

## 2022-05-29 DIAGNOSIS — D219 Benign neoplasm of connective and other soft tissue, unspecified: Secondary | ICD-10-CM | POA: Diagnosis not present

## 2022-05-29 NOTE — Progress Notes (Signed)
GYNECOLOGY OFFICE VISIT NOTE  History:   Daisy [name] is a 42 y.o. [id number] here today for follow up for AUB in the setting of a large subserosal fibroid.  She has just received second dose of Depo Provera, still has spotting most days.  She wants to discuss other options.  She denies any abnormal vaginal discharge, pelvic pain or other concerns.    Past Medical History:  Diagnosis Date   Anxiety    Depression    Hypertension    Migraine     Past Surgical History:  Procedure Laterality Date   NO PAST SURGERIES      The following portions of the patient's history were reviewed and updated as appropriate: allergies, current medications, past family history, past medical history, past social history, past surgical history and problem list.   Health Maintenance:  Normal pap and negative HRHPV on 11/05/2019.     Review of Systems:  Pertinent items noted in HPI and remainder of comprehensive ROS otherwise negative.  Physical Exam:  BP 130/88   Pulse 77   Ht 5\' 7"  (1.702 m)   Wt 193 lb (87.5 kg)   LMP 05/17/2022   BMI 30.23 kg/m  CONSTITUTIONAL: Well-developed, well-nourished female in no acute distress.  HEENT:  Normocephalic, atraumatic. External right and left ear normal. No scleral icterus.  NECK: Normal range of motion, supple, no masses noted on observation SKIN: No rash noted. Not diaphoretic. No erythema. No pallor. MUSCULOSKELETAL: Normal range of motion. No edema noted. NEUROLOGIC: Alert and oriented to person, place, and time. Normal muscle tone coordination. No cranial nerve deficit noted. PSYCHIATRIC: Normal mood and affect. Normal behavior. Normal judgment and thought content. CARDIOVASCULAR: Normal heart rate noted RESPIRATORY: Effort and breath sounds normal, no problems with respiration noted ABDOMEN: No masses noted. No other overt distention noted.   PELVIC: Deferred  Imaging US Pelvic Complete With Transvaginal  Result Date: 05/05/2022 CLINICAL DATA:   Suspected uterine fibroid on CT EXAM: TRANSABDOMINAL AND TRANSVAGINAL ULTRASOUND OF PELVIS TECHNIQUE: Both transabdominal and transvaginal ultrasound examinations of the pelvis were performed. Transabdominal technique was performed for global imaging of the pelvis including uterus, ovaries, adnexal regions, and pelvic cul-de-sac. It was necessary to proceed with endovaginal exam following the transabdominal exam to visualize the endometrium. COMPARISON:  None Available. FINDINGS: Uterus Measurements: 10.5 x 6.4 x 7.6 cm = volume: 270 mL. Multiple uterine fibroids, including a dominant 6.3 x 5.7 x 6.3 cm subserosal fibroid in the left posterior uterine body. Endometrium Thickness: 6 mm.  Trace fluid in the endometrial fundus. Right ovary Measurements: 4.2 x 2.0 x 2.6 cm = volume: 11.4 mL. Normal appearance/no adnexal mass. Left ovary Measurements: 3.2 x 1.6 x 2.0 cm = volume: 5.4 mL. Normal appearance/no adnexal mass. Other findings No abnormal free fluid. IMPRESSION: Multiple uterine fibroids, including a dominant 6.3 cm subserosal fibroid in the left posterior uterine body. Electronically Signed   By: Charline Bills M.D.   On: 05/05/2022 00:54      Assessment and Plan:     1. Menorrhagia with regular cycle 2. Fibroids Reviewed ultrasound results with patients.  Patient was reassured that spotting is not irregular with Depo Provera in the first couple of cycles, this can resolve over time.   However, discussed other management options for abnormal uterine bleeding with her fibroids including continuing her Depo Provera or trying Levonogestrel IUD; uterine fibroid embolization; myomectomy or hysterectomy as definitive surgical management.  Discussed risks and benefits of each method.  Patient desires uterine fibroid embolization, referred to IR.  Also considering laparoscopic myomectomy or hysterectomy, does not want open surgery.  Printed patient education handouts were given to the patient to review at  home.  - Ambulatory referral to Interventional Radiology Please refer to After Visit Summary for other counseling recommendations.   Return in about 7 weeks (around 07/17/2022) for Follow up, possible discussion of laparoscopic surgery with Dr. Para March.    I spent 25 minutes dedicated to the care of this patient including pre-visit review of records, face to face time with the patient discussing her conditions and treatments and post visit orders.    Jaynie Collins, MD, FACOG Obstetrician & Gynecologist, [facility] for [employer], [facility]

## 2022-06-05 ENCOUNTER — Other Ambulatory Visit: Payer: Self-pay | Admitting: *Deleted

## 2022-06-05 ENCOUNTER — Encounter: Payer: Self-pay | Admitting: *Deleted

## 2022-06-05 MED ORDER — MEGESTROL ACETATE 40 MG PO TABS: 40.0000 mg | ORAL_TABLET | Freq: Every day | ORAL | 1 refills | Status: DC

## 2022-06-05 MED ORDER — NAPROXEN 500 MG PO TBEC: 500.0000 mg | DELAYED_RELEASE_TABLET | Freq: Two times a day (BID) | ORAL | 1 refills | Status: DC

## 2022-06-05 NOTE — Progress Notes (Signed)
Spoke with pt who is having heavy vaginal bleeding that is getting worse and having clost.  She saw Dr Macon Large last week.  Dr Mont Dutton recommendations are:  We can add Megace 40 mg po daily to see if that will help (unsure if this will help as she already has Depo Provera on board).  Or a course of Lysteda 1300 mg po tid x 5 days and Naproxen 500 or 550 mg bid with meals.  Pt is willing to try the Megace and Naprosyn.    She will reach out in 1 week to see how the bleeding is going.

## 2022-06-25 ENCOUNTER — Encounter: Payer: Self-pay | Admitting: Plastic Surgery

## 2022-06-25 ENCOUNTER — Ambulatory Visit (INDEPENDENT_AMBULATORY_CARE_PROVIDER_SITE_OTHER): Payer: 59 | Admitting: Plastic Surgery

## 2022-06-25 VITALS — BP 138/87 | HR 83 | Ht 66.0 in | Wt 195.8 lb

## 2022-06-25 DIAGNOSIS — R222 Localized swelling, mass and lump, trunk: Secondary | ICD-10-CM | POA: Diagnosis not present

## 2022-06-25 DIAGNOSIS — D489 Neoplasm of uncertain behavior, unspecified: Secondary | ICD-10-CM

## 2022-06-25 NOTE — Progress Notes (Signed)
Referring Provider Christen Butter, NP [address] [place],  [place] [address]   CC:  Chief Complaint  Patient presents with   Advice Only      Daisy [name] is an 42 y.o. female.  HPI: Daisy [name] is a 42 year old female who had a lipoma removed from her back approximately 3 years ago.  Patient states that from the very beginning the area was never flat and has been increasing in size since the initial excision.  She denies any pain but does feel that the increase in size is uncomfortable.  She would like to have this treated.  Allergies  Allergen Reactions   Atenolol     Hair loss and skin eruption Hair loss and skin eruption Hair loss and skin eruption Hair loss and skin eruption Hair loss and skin eruption    Latex Itching   Cephalexin Diarrhea    Outpatient Encounter Medications as of 06/25/2022  Medication Sig   amLODipine (NORVASC) 5 MG tablet Take 1 tablet (5 mg total) by mouth daily.   megestrol (MEGACE) 40 MG tablet Take 1 tablet (40 mg total) by mouth daily.   [DISCONTINUED] medroxyPROGESTERone (DEPO-PROVERA) 150 MG/ML injection Inject 1 mL (150 mg total) into the muscle every 3 (three) months.   [DISCONTINUED] naproxen (EC NAPROSYN) 500 MG EC tablet Take 1 tablet (500 mg total) by mouth 2 (two) times daily with a meal. (Patient not taking: Reported on 06/25/2022)   No facility-administered encounter medications on file as of 06/25/2022.     Past Medical History:  Diagnosis Date   Anxiety    Depression    Hypertension    Migraine     Past Surgical History:  Procedure Laterality Date   NO PAST SURGERIES      Family History  Problem Relation Age of Onset   Alcohol abuse Maternal Aunt    Depression Maternal Aunt    Drug abuse Maternal Uncle        crack   Alcohol abuse Maternal Grandmother     Social History   Social History Narrative   Not on file     Review of Systems General: Denies fevers, chills, weight loss CV: Denies  chest pain, shortness of breath, palpitations Skin: There is a soft mass on the right upper back overlying the trapezius.  No erythema no drainage and the patient does not complain of pain with palpation  Physical Exam    06/25/2022    8:21 AM 05/29/2022   10:30 AM 03/26/2022    8:47 AM  Vitals with BMI  Height 5\' 6"  5\' 7"  5\' 7"   Weight 195 lbs 13 oz 193 lbs 197 lbs 5 oz  BMI 31.62 30.22 30.89  Systolic 138 130 604  Diastolic 87 88 86  Pulse 83 77 80    General:  No acute distress,  Alert and oriented, Non-Toxic, Normal speech and affect Integument: As noted there is a soft mass palpable over the right trapezius.  Measures approximately 8 x 5 cm. Mammogram: No mammogram results are available Assessment/Plan Mass right shoulder: Patient had a mass excised in the past which she states was a lipoma.  She has had recurrence of swelling in the area since that time.  It is unclear whether this is a recurrence of the lipoma or the patient had a seroma which was never adequately treated at the time of the original procedure I will order an ultrasound to evaluate she will follow-up with me after  the ultrasound is complete.  Will plan for surgical treatment whether this is excision of the lipoma or return to the operating room for extirpation the seroma cavity.  Santiago Glad 06/25/2022, 8:30 AM

## 2022-07-04 ENCOUNTER — Emergency Department (HOSPITAL_BASED_OUTPATIENT_CLINIC_OR_DEPARTMENT_OTHER)
Admission: EM | Admit: 2022-07-04 | Discharge: 2022-07-04 | Discharge status: Against Medical Advice | Payer: 59 | Attending: Emergency Medicine | Admitting: Emergency Medicine

## 2022-07-04 ENCOUNTER — Encounter (HOSPITAL_BASED_OUTPATIENT_CLINIC_OR_DEPARTMENT_OTHER): Payer: Self-pay | Admitting: Emergency Medicine

## 2022-07-04 ENCOUNTER — Other Ambulatory Visit: Payer: Self-pay

## 2022-07-04 ENCOUNTER — Other Ambulatory Visit (HOSPITAL_BASED_OUTPATIENT_CLINIC_OR_DEPARTMENT_OTHER): Payer: Self-pay

## 2022-07-04 DIAGNOSIS — N939 Abnormal uterine and vaginal bleeding, unspecified: Secondary | ICD-10-CM | POA: Insufficient documentation

## 2022-07-04 DIAGNOSIS — Z5321 Procedure and treatment not carried out due to patient leaving prior to being seen by health care provider: Secondary | ICD-10-CM | POA: Diagnosis not present

## 2022-07-04 NOTE — ED Provider Notes (Signed)
[place] EMERGENCY DEPARTMENT AT [facility] Provider Note   CSN: [id number] Arrival date & time: 07/04/22  0930     History  Chief Complaint  Patient presents with   Vaginal Bleeding    Daisy [name] is a 42 y.o. female.  42 yo F with a chief complaints of heavy vaginal bleeding.  This has been going on for about 6 months.  She is on Depo-Provera shots has been seen by GYN.  Most recently was seen about a month ago in the office and was started on Megace.  She took her last dose a couple days ago.  Feels like she is passing more clots.  Having some lower pelvic cramping with this as well.  She denies fevers denies vaginal discharge.  She has been seen by different providers as an outpatient as they try to figure out what the next best steps are for her, she has uterine fibroids and was given multiple options to try to fix her ongoing bleeding.  She is frustrated because she feels like nobody is doing anything acutely and things are getting worse.   Vaginal Bleeding      Home Medications Prior to Admission medications   Medication Sig Start Date End Date Taking? Authorizing Provider  amLODipine (NORVASC) 5 MG tablet Take 1 tablet (5 mg total) by mouth daily. 03/26/22   Christen Butter, NP  megestrol (MEGACE) 40 MG tablet Take 1 tablet (40 mg total) by mouth daily. 06/05/22   Tereso Newcomer, MD      Allergies    Atenolol, Latex, and Cephalexin    Review of Systems   Review of Systems  Genitourinary:  Positive for vaginal bleeding.    Physical Exam Updated Vital Signs BP (!) 159/110   Pulse 87   Resp 18   Ht 5\' 6"  (1.676 m)   Wt 86.2 kg   SpO2 99%   BMI 30.67 kg/m  Physical Exam Vitals and nursing note reviewed.  Constitutional:      General: She is not in acute distress.    Appearance: She is well-developed. She is not diaphoretic.  HENT:     Head: Normocephalic and atraumatic.  Eyes:     Pupils: Pupils are equal, round, and reactive to light.   Cardiovascular:     Rate and Rhythm: Normal rate and regular rhythm.     Heart sounds: No murmur heard.    No friction rub. No gallop.  Pulmonary:     Effort: Pulmonary effort is normal.     Breath sounds: No wheezing or rales.  Abdominal:     General: There is no distension.     Palpations: Abdomen is soft.     Tenderness: There is no abdominal tenderness.  Musculoskeletal:        General: No tenderness.     Cervical back: Normal range of motion and neck supple.  Skin:    General: Skin is warm and dry.  Neurological:     Mental Status: She is alert and oriented to person, place, and time.  Psychiatric:        Behavior: Behavior normal.     ED Results / Procedures / Treatments   Labs (all labs ordered are listed, but only abnormal results are displayed) Labs Reviewed  PREGNANCY, URINE  CBC    EKG None  Radiology No results found.  Procedures Procedures    Medications Ordered in ED Medications - No data to display  ED Course/ Medical Decision Making/ A&P  Medical Decision Making Amount and/or Complexity of Data Reviewed Labs: ordered.   42 yo F with a chief complaints of vaginal bleeding.  Going on for 6 months.  Has been seen by GYN and has had an ultrasound demonstrating multiple uterine fibroids.  She has had a consultation scheduled with IR and as well as being evaluated for possible hysterectomy.  She just finished her course of Megace a couple days ago and is having some worsening bleeding.  She is well-appearing and nontoxic.  Not tachycardic.  Has a benign abdominal exam.  I discussed with her about obtaining pregnancy test and performing a pelvic exam to assess for the extent of bleeding as well as obtaining a blood count.  After I left the room I was notified that the patient had left the department.  The patients results and plan were reviewed and discussed.   Any x-rays performed were independently reviewed by myself.    Differential diagnosis were considered with the presenting HPI.  Medications - No data to display  Vitals:   07/04/22 0943 07/04/22 1007  BP: (!) 159/110   Pulse: 87   Resp: 18   SpO2: 99%   Weight: 87.1 kg 86.2 kg  Height:  5\' 6"  (1.676 m)    Final diagnoses:  Vaginal bleeding           Final Clinical Impression(s) / ED Diagnoses Final diagnoses:  Vaginal bleeding    Rx / DC Orders ED Discharge Orders     None         Melene Plan, DO 07/04/22 1018

## 2022-07-04 NOTE — ED Triage Notes (Signed)
Pt arrives pov, steady gait with c/o lower ABD cramping yesterday and vaginal bleeding since January. Currently being tx for same, but endorses passing larger blood clots

## 2022-07-07 ENCOUNTER — Other Ambulatory Visit (HOSPITAL_COMMUNITY)
Admission: RE | Admit: 2022-07-07 | Discharge: 2022-07-07 | Disposition: A | Discharge status: Home / Self Care | Payer: 59 | Source: Ambulatory Visit | Attending: Obstetrics and Gynecology | Admitting: Obstetrics and Gynecology

## 2022-07-07 ENCOUNTER — Encounter: Payer: Self-pay | Admitting: Obstetrics and Gynecology

## 2022-07-07 ENCOUNTER — Ambulatory Visit (INDEPENDENT_AMBULATORY_CARE_PROVIDER_SITE_OTHER): Payer: 59 | Admitting: Obstetrics and Gynecology

## 2022-07-07 VITALS — BP 144/94 | HR 81 | Wt 195.0 lb

## 2022-07-07 DIAGNOSIS — N939 Abnormal uterine and vaginal bleeding, unspecified: Secondary | ICD-10-CM

## 2022-07-07 DIAGNOSIS — I10 Essential (primary) hypertension: Secondary | ICD-10-CM | POA: Diagnosis not present

## 2022-07-07 HISTORY — PX: ENDOMETRIAL BIOPSY: PRO73

## 2022-07-07 MED ORDER — MEGESTROL ACETATE 40 MG PO TABS: 40.0000 mg | ORAL_TABLET | Freq: Two times a day (BID) | ORAL | 1 refills | Status: DC

## 2022-07-07 NOTE — Progress Notes (Signed)
Obstetrics and Gynecology Work In Patient Evaluation  Appointment Date: 07/07/2022  OBGYN Clinic: [facility]  Primary Care Provider: Christen [name]  Referring Provider: Self  Chief Complaint:  ED follow up Chief Complaint  Patient presents with   Menorrhagia    History of Present Illness: Daisy [name] is a 42 y.o.  G5P2 (LMP: unknown), seen for the above chief complaint. Her past medical history is significant for HTN, BMI 31, fibroids  Patient seen on 6/7 in the ED for acute on chronic VB. Hbg stable and pt called requesting sooner visit than her 6/27 with Dr. Para March, due to the heavy bleeding. [place] office didn't have any openings so one made for today here at [facility].  Patient previously followed by Dr. Macon Large, last visit  05/29/22, where she was on depo provera for fibroids, with IR referral and referral to Dr. Para March made on 5/2.   Patient states she was passing golf ball sized clots but currently none. She has been on the megace 40 qday since 5/9 and she confirms this. No s/s of anemia  Review of Systems: Pertinent items noted in HPI and remainder of comprehensive ROS otherwise negative.   Past Medical History:  Past Medical History:  Diagnosis Date   Anxiety    Depression    Hypertension    Migraine     Past Surgical History:  Past Surgical History:  Procedure Laterality Date   NO PAST SURGERIES      Past Obstetrical History:  OB History  Gravida Para Term Preterm AB Living  5       3 1   SAB IAB Ectopic Multiple Live Births    2     1    # Outcome Date GA Lbr Len/2nd Weight Sex Delivery Anes PTL Lv  5 Gravida           4 IAB 10/08/13          3 AB 2013          2 Gravida 04/16/05    M Vag-Spont EPI N LIV  1 IAB 1999            Past Gynecological History: As per HPI. Periods: patient unsure given the AUB History of Pap Smear(s): Yes.   Last pap 10/2019, which was cyto and hpv negative  Social History:  Social  History   Socioeconomic History   Marital status: Single    Spouse name: Not on file   Number of children: 1   Years of education: Not on file   Highest education level: Not on file  Occupational History   Not on file  Tobacco Use   Smoking status: Never   Smokeless tobacco: Never  Vaping Use   Vaping Use: Never used  Substance and Sexual Activity   Alcohol use: Yes    Comment: occasionally   Drug use: No   Sexual activity: Yes    Birth control/protection: Injection  Other Topics Concern   Not on file  Social History Narrative   Not on file   Social Determinants of Health   Financial Resource Strain: Not on file  Food Insecurity: Not on file  Transportation Needs: Not on file  Physical Activity: Not on file  Stress: Not on file  Social Connections: Not on file  Intimate Partner Violence: Not on file    Family History:  Family History  Problem Relation Age of Onset   Alcohol abuse Maternal Aunt    Depression  Maternal Aunt    Drug abuse Maternal Uncle        crack   Alcohol abuse Maternal Grandmother    Medications Daisy [name] had no medications administered during this visit. Current Outpatient Medications  Medication Sig Dispense Refill   amLODipine (NORVASC) 5 MG tablet Take 1 tablet (5 mg total) by mouth daily. 90 tablet 1   megestrol (MEGACE) 40 MG tablet Take 1 tablet (40 mg total) by mouth daily. 30 tablet 1   No current facility-administered medications for this visit.    Allergies Atenolol, Latex, and Cephalexin   Physical Exam:  BP (!) 138/90   Pulse 88   Wt 195 lb (88.5 kg)   BMI 31.47 kg/m  Body mass index is 31.47 kg/m. General appearance: Well nourished, well developed female in no acute distress.  Neck:  Supple, normal appearance, and no thyromegaly  Cardiovascular: normal s1 and s2.  No murmurs, rubs or gallops. Respiratory:  Clear to auscultation bilateral. Normal respiratory effort Abdomen: positive bowel sounds and no masses,  hernias; diffusely non tender to palpation, non distended Neuro/Psych:  Normal mood and affect.  Skin:  Warm and dry.  Lymphatic:  No inguinal lymphadenopathy.   Cervical exam performed in the presence of a chaperone Pelvic exam: is not limited by body habitus EGBUS: within normal limits Vagina: within normal limits and with no blood or discharge in the vault Cervix: normal appearing cervix without tenderness, discharge or lesions.  See procedure note for embx details  Laboratory: no new labs UPT neg 6/7    Latest Ref Rng & Units 03/26/2022    9:50 AM 12/24/2021    2:01 PM 12/18/2021   10:03 AM  CBC  WBC 3.8 - 10.8 Thousand/uL 5.8  4.9  7.1   Hemoglobin 11.7 - 15.5 g/dL 16.1  09.6  04.5   Hematocrit 35.0 - 45.0 % 38.8  38.0  39.7   Platelets 140 - 400 Thousand/uL 248  265  232    Radiology: no new imaging Narrative & Impression  CLINICAL DATA:  Suspected uterine fibroid on CT   EXAM: TRANSABDOMINAL AND TRANSVAGINAL ULTRASOUND OF PELVIS   TECHNIQUE: Both transabdominal and transvaginal ultrasound examinations of the pelvis were performed. Transabdominal technique was performed for global imaging of the pelvis including uterus, ovaries, adnexal regions, and pelvic cul-de-sac. It was necessary to proceed with endovaginal exam following the transabdominal exam to visualize the endometrium.   COMPARISON:  None Available.   FINDINGS: Uterus   Measurements: 10.5 x 6.4 x 7.6 cm = volume: 270 mL. Multiple uterine fibroids, including a dominant 6.3 x 5.7 x 6.3 cm subserosal fibroid in the left posterior uterine body.   Endometrium   Thickness: 6 mm.  Trace fluid in the endometrial fundus.   Right ovary   Measurements: 4.2 x 2.0 x 2.6 cm = volume: 11.4 mL. Normal appearance/no adnexal mass.   Left ovary   Measurements: 3.2 x 1.6 x 2.0 cm = volume: 5.4 mL. Normal appearance/no adnexal mass.   Other findings   No abnormal free fluid.   IMPRESSION: Multiple  uterine fibroids, including a dominant 6.3 cm subserosal fibroid in the left posterior uterine body.     Electronically Signed   By: Charline Bills M.D.   On: 05/05/2022 00:54   Assessment: patient stable  Plan:  1. Abnormal uterine bleeding (AUB) I told her I recommended work up so Dr. Para March has all the info to talk to her about the different options, which she  was amenable to and done today. I also told her I recommend increasing her megace to 40 bid - Cytology - PAP( [place]) - TSH Rfx on Abnormal to Free T4 - Surgical pathology( [place]/ [facility])  2. Essential hypertension, benign  Orders Placed This Encounter  Procedures   TSH Rfx on Abnormal to Free T4    RTC PRN  No follow-ups on file.  Future Appointments  Date Time Provider Department Center  07/24/2022  9:30 AM Milas Hock, MD [facility]  09/24/2022  9:10 AM Daisy Butter, NP [facility] None    Cornelia Copa MD Attending Center for [facility])

## 2022-07-07 NOTE — Procedures (Signed)
Endometrial Biopsy Procedure Note  Pre-operative Diagnosis: AUB. Fibroids  Post-operative Diagnosis: same   Procedure Details  Cervical exam performed in the presence of a chaperone Urine pregnancy test was not done.  The risks (including infection, bleeding, pain, and uterine perforation) and benefits of the procedure were explained to the patient and Written informed consent was obtained.  The patient was placed in the dorsal lithotomy position.  A Graves' speculum inserted in the vagina, and the cervix was visualized and a pap smear performed. The cervix was then prepped with povidone iodine, and a sharp tenaculum was applied to the anterior lip of the cervix for stabilization.  A pipelle was inserted into the uterine cavity and sounded the uterus to a depth of 9.5cm.  A small amount of tissue was collected after 3 passes (aspiration appeared to mostly be blood). The sample was sent for pathologic examination.  Condition: Stable  Complications: None  Plan: The patient was advised to call for any fever or for prolonged or severe pain or bleeding. She was advised to use OTC analgesics as needed for mild to moderate pain. She was advised to avoid vaginal intercourse for 48 hours or until the bleeding has completely stopped.  Cornelia Copa MD Attending Center for [employer] [facility])

## 2022-07-07 NOTE — Progress Notes (Signed)
RGYN pt here for problem visit.  NF:AOZHY bleeding off/on notes large clots Friday clots were like golf balls.  No heavy bleeding today.  Pt notes vaginal irritation from having to wear pads all the time and using wipes. No odor just irritation on the outside.  Still taking Megace 40mg   Irregular bleeding since 01/2022   Pt states Partial HYST is not scheduled yet has consult coming up.

## 2022-07-08 LAB — SURGICAL PATHOLOGY

## 2022-07-08 LAB — TSH RFX ON ABNORMAL TO FREE T4: TSH: 3.99 u[IU]/mL (ref 0.450–4.500)

## 2022-07-11 LAB — CYTOLOGY - PAP
Chlamydia: NEGATIVE
Comment: NEGATIVE
Comment: NEGATIVE
Comment: NEGATIVE
Comment: NORMAL
Diagnosis: NEGATIVE
Diagnosis: REACTIVE
High risk HPV: NEGATIVE
Neisseria Gonorrhea: NEGATIVE
Trichomonas: NEGATIVE

## 2022-07-16 ENCOUNTER — Ambulatory Visit (HOSPITAL_BASED_OUTPATIENT_CLINIC_OR_DEPARTMENT_OTHER)
Admission: RE | Admit: 2022-07-16 | Discharge: 2022-07-16 | Disposition: A | Discharge status: Home / Self Care | Payer: 59 | Source: Ambulatory Visit | Attending: Plastic Surgery | Admitting: Plastic Surgery

## 2022-07-16 ENCOUNTER — Other Ambulatory Visit: Payer: Self-pay | Admitting: Plastic Surgery

## 2022-07-16 DIAGNOSIS — D489 Neoplasm of uncertain behavior, unspecified: Secondary | ICD-10-CM | POA: Diagnosis present

## 2022-07-22 NOTE — H&P (View-Only) (Signed)
GYNECOLOGY OFFICE VISIT NOTE  History:   Daisy [name] is a 42 y.o. G5P1 here today for discussion for surgery.   She has AUB/fibroids.   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  Patient previously followed by Dr. Macon Large, last visit  05/29/22, where she was on depo provera for fibroids, with IR referral and referral to Dr. Para March made on 5/2.    She bleeds heavily and passes golf ball size clots in the past. She was increased at her appt with Dr. Vergie Living to BID Megace.   On 6/10 she saw Dr. Vergie Living who did a pap/HPV (normal/neg) and EMB (neg for hyperplasia/malignancy).   She notes other symptoms of constipation.   She has a history of 1 SVD, tested to 7lb.      Past Medical History:  Diagnosis Date   Anxiety    Depression    Hypertension    Migraine     Past Surgical History:  Procedure Laterality Date   ENDOMETRIAL BIOPSY  07/07/2022   NO PAST SURGERIES      The following portions of the patient's history were reviewed and updated as appropriate: allergies, current medications, past family history, past medical history, past social history, past surgical history and problem list.   Health Maintenance:   Diagnosis  Date Value Ref Range Status  07/07/2022 - Benign reactive/reparative changes  Final  07/07/2022   Final   - Negative for Intraepithelial Lesions or Malignancy (NILM)    Has not yet had a mxr.   Review of Systems:  Pertinent items noted in HPI and remainder of comprehensive ROS otherwise negative.  Physical Exam:  BP (!) 152/95   Pulse 93   Ht 5\' 7"  (1.702 m)   Wt 190 lb (86.2 kg)   Breastfeeding No   BMI 29.76 kg/m  CONSTITUTIONAL: Well-developed, well-nourished female in no acute distress.  HEENT:  Normocephalic, atraumatic. External right and left ear normal. No scleral icterus.  NECK: Normal range of motion, supple, no masses noted on observation SKIN: No rash noted. Not diaphoretic. No erythema. No pallor. MUSCULOSKELETAL:  Normal range of motion. No edema noted. NEUROLOGIC: Alert and oriented to person, place, and time. Normal muscle tone coordination. No cranial nerve deficit noted. PSYCHIATRIC: Normal mood and affect. Normal behavior. Normal judgment and thought content.  PELVIC: Normal appearing external genitalia; normal urethral meatus; normal appearing vaginal mucosa and cervix.  Posterior fibroid is noted just above the cervicovaginal junction by about 1 cm. Uterus is mobile. No uterine or adnexal tenderness. Performed in the presence of a chaperone  Pelvic US - I reviewed the report and the images myself. Uterus is overall small but composed of several subserosal and intramural fibroids. These would have little clinically significant from a surgical standpoint. The main fibroid is subserosal and is on the left, midbody aspect of the uterus. It is difficult to tell from the pictures the proximity to the cervix which would be the main point of difficulty in her surgery. That fibroid is 5-6 cm in size.   Assessment and Plan:   1. Abnormal uterine bleeding (AUB) - Diagnosis: Menorrhagia/AUB - Planned surgery: TLH, bilateral salpingectomy and cystoscopy.  - Risks of surgery include but are not limited to:  Bleeding - Can bleed enough to need transfusion or need for additional surgeries I.e. conversation to open surgery.  Infection - The vagina can develop cuff infection Injury to surrounding organs/tissues (i.e. bowel/bladder/ureters) - discussed how each of these is  managed I.e. bladder injury requires catheter for 10-14 days after surgery Need for additional procedures - Would be specific to a complication or injury Wound complications - No specific wound unless converted to open surgery or laparoscopic Hospital re-admission - In the event of a delayed complication being recognized I.e. ureteral injury discussed Conversion to open surgery - reviewed some complications may necessitate or it may be the only way  to complete the surgery.   VTE - Discussed risk of blood clots following delivery - We discussed alternatives to hysterectomy including birth control options that will work better to regulate bleeding, I.e. [place], IUD, endometrial ablation. She desires definitive management.  - We discussed postop restrictions, precautions and expectations - Preop testing needed:  BMP, CBC and EKG.  - All questions answered   2. Fibroids See above -     CBC -     Electrocardiogram report -     Basic metabolic panel    Routine preventative health maintenance measures emphasized. Please refer to After Visit Summary for other counseling recommendations.   No follow-ups on file.  Milas Hock, MD, FACOG Obstetrician & Gynecologist, [facility] for [facility], [facility]

## 2022-07-22 NOTE — Progress Notes (Unsigned)
GYNECOLOGY OFFICE VISIT NOTE  History:   Daisy [name] is a 42 y.o. G5P2 here today for discussion for surgery.   She has AUB/fibroids.   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  Patient previously followed by Dr. Macon Large, last visit  05/29/22, where she was on depo provera for fibroids, with IR referral and referral to Dr. Para March made on 5/2.    She bleeds heavily and passes golf ball size clots in the past. She was increased at her appt with Dr. Vergie Living to BID Megace.   On 6/10 she saw Dr. Vergie Living who did a pap/HPV (normal/neg) and EMB (neg for hyperplasia/malignancy).      Past Medical History:  Diagnosis Date   Anxiety    Depression    Hypertension    Migraine     Past Surgical History:  Procedure Laterality Date   ENDOMETRIAL BIOPSY  07/07/2022   NO PAST SURGERIES      The following portions of the patient's history were reviewed and updated as appropriate: allergies, current medications, past family history, past medical history, past social history, past surgical history and problem list.   Health Maintenance:   Diagnosis  Date Value Ref Range Status  07/07/2022 - Benign reactive/reparative changes  Final  07/07/2022   Final   - Negative for Intraepithelial Lesions or Malignancy (NILM)    Has not yet had a mxr.   Review of Systems:  Pertinent items noted in HPI and remainder of comprehensive ROS otherwise negative.  Physical Exam:  There were no vitals taken for this visit. CONSTITUTIONAL: Well-developed, well-nourished female in no acute distress.  HEENT:  Normocephalic, atraumatic. External right and left ear normal. No scleral icterus.  NECK: Normal range of motion, supple, no masses noted on observation SKIN: No rash noted. Not diaphoretic. No erythema. No pallor. MUSCULOSKELETAL: Normal range of motion. No edema noted. NEUROLOGIC: Alert and oriented to person, place, and time. Normal muscle tone coordination. No cranial nerve deficit  noted. PSYCHIATRIC: Normal mood and affect. Normal behavior. Normal judgment and thought content.  PELVIC: Deferred  Pelvic US - I reviewed the report and the images myself. Uterus is overall small but composed of several subserosal and intramural fibroids. These would have little clinically significant from a surgical standpoint. The main fibroid is subserosal and is on the left, midbody aspect of the uterus. It is difficult to tell from the pictures the proximity to the cervix which would be the main point of difficulty in her surgery. That fibroid is 5-6 cm in size.   Assessment and Plan:   1. Abnormal uterine bleeding (AUB) - Diagnosis: Menorrhagia/AUB - Planned surgery: TLH, bilateral salpingectomy and cystoscopy. *** - Risks of surgery include but are not limited to:  Bleeding - Can bleed enough to need transfusion or need for additional surgeries I.e. conversation to open surgery.  Infection - The vagina can develop cuff infection Injury to surrounding organs/tissues (i.e. bowel/bladder/ureters) - discussed how each of these is managed I.e. bladder injury requires catheter for 10-14 days after surgery Need for additional procedures - Would be specific to a complication or injury Wound complications - No specific wound unless converted to open surgery or laparoscopic Hospital re-admission - In the event of a delayed complication being recognized I.e. ureteral injury discussed Conversion to open surgery - reviewed some complications may necessitate or it may be the only way to complete the surgery.   VTE - Discussed risk of blood clots following  delivery - We discussed alternatives to hysterectomy including birth control options that will work better to regulate bleeding, I.e. IUD, endometrial ablation. She desires definitive management.  - We discussed postop restrictions, precautions and expectations - Preop testing needed:  BMP, CBC and EKG.  - All questions answered   2.  Fibroids See above    Diagnoses and all orders for this visit:  Abnormal uterine bleeding (AUB)  Fibroids    Routine preventative health maintenance measures emphasized. Please refer to After Visit Summary for other counseling recommendations.   No follow-ups on file.  Milas Hock, MD, FACOG Obstetrician & Gynecologist, [facility] for [facility], [facility]

## 2022-07-24 ENCOUNTER — Ambulatory Visit (INDEPENDENT_AMBULATORY_CARE_PROVIDER_SITE_OTHER): Payer: 59 | Admitting: Family Medicine

## 2022-07-24 ENCOUNTER — Ambulatory Visit (INDEPENDENT_AMBULATORY_CARE_PROVIDER_SITE_OTHER): Payer: 59 | Admitting: Obstetrics and Gynecology

## 2022-07-24 ENCOUNTER — Encounter: Payer: Self-pay | Admitting: Obstetrics and Gynecology

## 2022-07-24 VITALS — BP 152/95 | HR 93 | Ht 67.0 in | Wt 190.0 lb

## 2022-07-24 VITALS — BP 151/90 | HR 91 | Ht 67.0 in | Wt 190.0 lb

## 2022-07-24 DIAGNOSIS — Z01810 Encounter for preprocedural cardiovascular examination: Secondary | ICD-10-CM | POA: Diagnosis not present

## 2022-07-24 DIAGNOSIS — Z01811 Encounter for preprocedural respiratory examination: Secondary | ICD-10-CM | POA: Insufficient documentation

## 2022-07-24 DIAGNOSIS — Z01818 Encounter for other preprocedural examination: Secondary | ICD-10-CM | POA: Insufficient documentation

## 2022-07-24 DIAGNOSIS — N939 Abnormal uterine and vaginal bleeding, unspecified: Secondary | ICD-10-CM | POA: Diagnosis not present

## 2022-07-24 DIAGNOSIS — R030 Elevated blood-pressure reading, without diagnosis of hypertension: Secondary | ICD-10-CM | POA: Insufficient documentation

## 2022-07-24 DIAGNOSIS — D219 Benign neoplasm of connective and other soft tissue, unspecified: Secondary | ICD-10-CM | POA: Diagnosis not present

## 2022-07-24 NOTE — Assessment & Plan Note (Signed)
-   pt has hx of htn but did not take her medication today. Discussed importance of meds

## 2022-07-24 NOTE — Progress Notes (Signed)
Established patient visit   Patient: Daisy [name]   DOB: 02/13/1980   42 y.o. Female  MRN: [id number] Visit Date: 07/24/2022  Today's healthcare provider: Charlton Amor, DO   Chief Complaint  Patient presents with   Medical Clearance    SUBJECTIVE    Chief Complaint  Patient presents with   Medical Clearance   HPI  Pt is here for pre op eval for hysterectomy secondary to fibroids.  Review of Systems  Constitutional:  Negative for activity change, fatigue and fever.  Respiratory:  Negative for cough and shortness of breath.   Cardiovascular:  Negative for chest pain.  Gastrointestinal:  Negative for abdominal pain.  Genitourinary:  Negative for difficulty urinating.       No outpatient medications have been marked as taking for the 07/24/22 encounter (Office Visit) with Charlton Amor, DO.    OBJECTIVE    BP (!) 151/90 (BP Location: Left Arm, Patient Position: Sitting, Cuff Size: Normal)   Pulse 91   Ht 5\' 7"  (1.702 m)   Wt 190 lb (86.2 kg)   SpO2 100%   BMI 29.76 kg/m   Physical Exam Vitals and nursing note reviewed.  Constitutional:      General: She is not in acute distress.    Appearance: Normal appearance.  HENT:     Head: Normocephalic and atraumatic.     Right Ear: External ear normal.     Left Ear: External ear normal.     Nose: Nose normal.     Mouth/Throat:     Comments: Mallampatic class 2 Eyes:     Conjunctiva/sclera: Conjunctivae normal.  Cardiovascular:     Rate and Rhythm: Normal rate and regular rhythm.  Pulmonary:     Effort: Pulmonary effort is normal.     Breath sounds: Normal breath sounds.  Neurological:     General: No focal deficit present.     Mental Status: She is alert and oriented to person, place, and time.  Psychiatric:        Mood and Affect: Mood normal.        Behavior: Behavior normal.        Thought Content: Thought content normal.        Judgment: Judgment normal.          ASSESSMENT & PLAN     Problem List Items Addressed This Visit       Other   Encounter for pre-operative respiratory clearance   Encounter for pre-operative examination - Primary    - pt presents for pre op evaluation for total hysterectomy secondary to fibroids - EKG done in room and shows NSR with HR 76 and no rhthym abnormalities or changes - able to walk up flights of stairs without getting short of breath or having difficulty - hysterectomy is a noncardiac surgery. Pt has hx of htn BMI <30, mallampati class 2 patient is a moderate risk for surgery      Relevant Orders   CBC   BASIC METABOLIC PANEL WITH GFR   Elevated blood pressure reading    - pt has hx of htn but did not take her medication today. Discussed importance of meds        No follow-ups on file.      No orders of the defined types were placed in this encounter.   Orders Placed This Encounter  Procedures   CBC    Order Specific Question:   CC Results    Answer:  Milas Hock [[id number]]   BASIC METABOLIC PANEL WITH GFR    Order Specific Question:   CC Results    Answer:   Milas Hock [[id number]]   EKG 12-Lead     Charlton Amor, DO  [facility] at [facility] [phone] (phone) [phone] (fax)  [facility]

## 2022-07-24 NOTE — Progress Notes (Signed)
Pt presented for EKG pre-surgical clearance. Sent by OB/GYN to PCP due to emergent upcoming hysterectomy.   Pt seen by Dr. Tamera Punt

## 2022-07-24 NOTE — Assessment & Plan Note (Signed)
-   pt presents for pre op evaluation for total hysterectomy secondary to fibroids - EKG done in room and shows NSR with HR 76 and no rhthym abnormalities or changes - able to walk up flights of stairs without getting short of breath or having difficulty - hysterectomy is a noncardiac surgery. Pt has hx of htn BMI <30, mallampati class 2 patient is a moderate risk for surgery

## 2022-07-25 ENCOUNTER — Other Ambulatory Visit: Payer: Self-pay | Admitting: Obstetrics and Gynecology

## 2022-07-25 DIAGNOSIS — D500 Iron deficiency anemia secondary to blood loss (chronic): Secondary | ICD-10-CM

## 2022-07-25 LAB — CBC
HCT: 32.9 % — ABNORMAL LOW (ref 35.0–45.0)
Hemoglobin: 10 g/dL — ABNORMAL LOW (ref 11.7–15.5)
MCH: 23.3 pg — ABNORMAL LOW (ref 27.0–33.0)
MCHC: 30.4 g/dL — ABNORMAL LOW (ref 32.0–36.0)
MCV: 76.7 fL — ABNORMAL LOW (ref 80.0–100.0)
MPV: 10.5 fL (ref 7.5–12.5)
Platelets: 403 10*3/uL — ABNORMAL HIGH (ref 140–400)
RBC: 4.29 10*6/uL (ref 3.80–5.10)
RDW: 13.1 % (ref 11.0–15.0)
WBC: 4.5 10*3/uL (ref 3.8–10.8)

## 2022-07-25 LAB — BASIC METABOLIC PANEL WITH GFR
BUN: 11 mg/dL (ref 7–25)
CO2: 24 mmol/L (ref 20–32)
Calcium: 9.6 mg/dL (ref 8.6–10.2)
Chloride: 106 mmol/L (ref 98–110)
Creat: 0.8 mg/dL (ref 0.50–0.99)
Glucose, Bld: 84 mg/dL (ref 65–99)
Potassium: 4 mmol/L (ref 3.5–5.3)
Sodium: 141 mmol/L (ref 135–146)
eGFR: 94 mL/min/{1.73_m2} (ref >=60)

## 2022-07-25 MED ORDER — FERROUS SULFATE 325 (65 FE) MG PO TBEC
325.0000 mg | DELAYED_RELEASE_TABLET | ORAL | 0 refills | Status: DC
Start: 2022-07-25 — End: 2022-08-12

## 2022-07-28 ENCOUNTER — Telehealth: Payer: Self-pay

## 2022-07-28 ENCOUNTER — Other Ambulatory Visit: Payer: Self-pay

## 2022-07-28 ENCOUNTER — Encounter (HOSPITAL_BASED_OUTPATIENT_CLINIC_OR_DEPARTMENT_OTHER): Payer: Self-pay | Admitting: Obstetrics and Gynecology

## 2022-07-28 DIAGNOSIS — Z01818 Encounter for other preprocedural examination: Secondary | ICD-10-CM | POA: Diagnosis not present

## 2022-07-28 NOTE — Telephone Encounter (Signed)
Patient called to schedule procedure w/ Dr. Para March. Patient requested the first available, and was scheduled on 08/12/22 @7 :30 am at [facility].Provided date, time location and pre-op instructions. I also advised pt would receive a copy of surgery details via mychart & a copy of instructions will be mailed to her home address.

## 2022-07-28 NOTE — Progress Notes (Addendum)
Your procedure is scheduled on 08-12-2022  Report to [facility] AT  530  AM.   Call this number if you have problems the morning of surgery  :[phone].   OUR ADDRESS IS [address].  WE ARE LOCATED IN [facility].  PLEASE BRING YOUR INSURANCE CARD AND PHOTO ID DAY OF SURGERY.  ONLY 2 PEOPLE ARE ALLOWED IN  WAITING  ROOM                                      REMEMBER:  DO NOT EAT FOOD, CANDY GUM OR MINTS  AFTER MIDNIGHT THE NIGHT BEFORE YOUR SURGERY . YOU MAY HAVE CLEAR LIQUIDS FROM MIDNIGHT THE NIGHT BEFORE YOUR SURGERY UNTIL 430 AM.  NO CLEAR LIQUIDS AFTER  430 AM DAY OF SURGERY.  YOU MAY  BRUSH YOUR TEETH MORNING OF SURGERY AND RINSE YOUR MOUTH OUT, NO CHEWING GUM CANDY OR MINTS.     CLEAR LIQUID DIET    Allowed      Water                                                                   Coffee and tea, regular and decaf  (NO cream or milk products of any type, may sweeten)                         Carbonated beverages, regular and diet                                    Sports drinks like Gatorade _____________________________________________________________________     TAKE ONLY THESE MEDICATIONS MORNING OF SURGERY: Amlodipine Megace                                        DO NOT WEAR JEWERLY/  METAL/  PIERCINGS (INCLUDING NO PLASTIC PIERCINGS) DO NOT WEAR LOTIONS, POWDERS, PERFUMES OR NAIL POLISH ON YOUR FINGERNAILS. TOENAIL POLISH IS OK TO WEAR. DO NOT SHAVE FOR 48 HOURS PRIOR TO DAY OF SURGERY.  CONTACTS, GLASSES, OR DENTURES MAY NOT BE WORN TO SURGERY.  REMEMBER: NO SMOKING, VAPING ,  DRUGS OR ALCOHOL FOR 24 HOURS BEFORE YOUR SURGERY.                                    [place] IS NOT RESPONSIBLE  FOR ANY BELONGINGS.                                                                    [name]           [place] - Preparing for Surgery Before surgery,  you can play an important role.  Because skin is not sterile,  your skin needs to be as free of germs as possible.  You can reduce the number of germs on your skin by washing with CHG (chlorahexidine gluconate) soap before surgery.  CHG is an antiseptic cleaner which kills germs and bonds with the skin to continue killing germs even after washing. Please DO NOT use if you have an allergy to CHG or antibacterial soaps.  If your skin becomes reddened/irritated stop using the CHG and inform your nurse when you arrive at Short Stay. Do not shave (including legs and underarms) for at least 48 hours prior to the first CHG shower.  You may shave your face/neck. Please follow these instructions carefully:  1.  Shower with CHG Soap the night before surgery and the  morning of Surgery.  2.  If you choose to wash your hair, wash your hair first as usual with your  normal  shampoo.  3.  After you shampoo, rinse your hair and body thoroughly to remove the  shampoo.                                        4.  Use CHG as you would any other liquid soap.  You can apply chg directly  to the skin and wash , chg soap provided, night before and morning of your surgery.  5.  Apply the CHG Soap to your body ONLY FROM THE NECK DOWN.   Do not use on face/ open                           Wound or open sores. Avoid contact with eyes, ears mouth and genitals (private parts).                       Wash face,  Genitals (private parts) with your normal soap.             6.  Wash thoroughly, paying special attention to the area where your surgery  will be performed.  7.  Thoroughly rinse your body with warm water from the neck down.  8.  DO NOT shower/wash with your normal soap after using and rinsing off  the CHG Soap.             9.  Pat yourself dry with a clean towel.            10.  Wear clean pajamas.            11.  Place clean sheets on your bed the night of your first shower and do not  sleep with pets. Day of Surgery : Do not apply any lotions/ powders the morning of surgery.   Please wear clean clothes to the hospital/surgery center.  IF YOU HAVE ANY SKIN IRRITATION OR PROBLEMS WITH THE SURGICAL SOAP, PLEASE GET A BAR OF GOLD DIAL SOAP AND SHOWER THE NIGHT BEFORE YOUR SURGERY AND THE MORNING OF YOUR SURGERY. PLEASE LET THE NURSE KNOW MORNING OF YOUR SURGERY IF YOU HAD ANY PROBLEMS WITH THE SURGICAL SOAP.   YOUR SURGEON MAY HAVE REQUESTED EXTENDED RECOVERY TIME AFTER YOUR SURGERY. IT COULD BE A  JUST A FEW HOURS  UP TO AN OVERNIGHT STAY.  YOUR SURGEON SHOULD HAVE DISCUSSED THIS WITH YOU PRIOR TO YOUR SURGERY.  IN THE EVENT YOU NEED TO STAY OVERNIGHT PLEASE REFER TO THE FOLLOWING GUIDELINES. YOU MAY HAVE UP TO 4 VISITORS  MAY VISIT IN THE EXTENDED RECOVERY ROOM UNTIL 800 PM ONLY.  ONE  VISITOR AGE 53 AND OVER MAY SPEND THE NIGHT AND MUST BE IN EXTENDED RECOVERY ROOM NO LATER THAN 800 PM . YOUR DISCHARGE TIME AFTER YOU SPEND THE NIGHT IS 900 AM THE MORNING AFTER YOUR SURGERY. YOU MAY PACK A SMALL OVERNIGHT BAG WITH TOILETRIES FOR YOUR OVERNIGHT STAY IF YOU WISH.  REGARDLESS OF IF YOU STAY OVER NIGHT OR ARE DISCHARGED THE SAME DAY YOU WILL BE REQUIRED TO HAVE A RESPONSIBLE ADULT (18 YRS OLD OR OLDER) STAY WITH YOU FOR AT LEAST THE FIRST 24 HOURS  YOUR PRESCRIPTION MEDICATIONS WILL BE PROVIDED DURING YOUR HOSPITAL STAY.  ________________________________________________________________________                                                        QUESTIONS Annye Asa  PRE OP NURSE PHONE [phone].

## 2022-07-28 NOTE — Progress Notes (Addendum)
Spoke w/ via phone for pre-op interview---PT Lab needs dos----  , EKG,  T & S, URINE PREG             Lab results------CBC 07-24-2022 EPIC, BMP 07-24-2022 EPIC COVID test -----patient states asymptomatic no test needed Arrive at -------530 am 08-11-2021 NPO after MN NO Solid Food.  Clear liquids from MN until---430 am Med rec completed Medications to take morning of surgery -----Amlodipine, megace Diabetic medication -----n/a Patient instructed no nail polish to be worn day of surgery Patient instructed to bring photo id and insurance card day of surgery Patient aware to have Driver (ride ) / caregiver    partner steven walden for 24 hours after surgery  Patient Special Instructions -----none Pre-Op special Instructions -----none Patient verbalized understanding of instructions that were given at this phone interview. Patient denies shortness of breath, chest pain, fever, cough at this phone interview.

## 2022-07-30 ENCOUNTER — Encounter: Payer: Self-pay | Admitting: Obstetrics and Gynecology

## 2022-07-30 ENCOUNTER — Ambulatory Visit (INDEPENDENT_AMBULATORY_CARE_PROVIDER_SITE_OTHER): Payer: 59 | Admitting: Surgical

## 2022-07-30 DIAGNOSIS — R222 Localized swelling, mass and lump, trunk: Secondary | ICD-10-CM

## 2022-07-30 DIAGNOSIS — D489 Neoplasm of uncertain behavior, unspecified: Secondary | ICD-10-CM

## 2022-07-30 NOTE — Progress Notes (Signed)
Patient is a 42 year old female here for follow-up to discuss ultrasound results after initial consultation with Dr. Ladona Ridgel to evaluate a back mass on 06/25/2022.  Patient underwent ultrasound on 07/16/2022, findings showed a mass measuring 5.6 x 2.9 x 3.5 cm in the region of the patient's lump.  The mass is isoechoic to surrounding fat.  The appearance would be good for a lipoma, recommend surgical consultation.  Patient reports she is scheduled for a laparoscopic hysterectomy with salpingectomy August 12, 2022 at [facility].  She reports that she would like to have the lipoma removed after that, she reports previously she had lipoma removed in the office, but would be open to removal in the operating room.  We discussed her ultrasound results today.  Recommend scheduling a virtual visit with Dr. Ladona Ridgel in approximately 6 weeks after recovery from her hysterectomy to discuss excision of the lipoma.  Recommend discussing with him OR versus clinic excision.  All of her questions were answered to her content

## 2022-08-05 NOTE — Telephone Encounter (Signed)
Msg sent to Daisy [name] to contact Preop.

## 2022-08-07 ENCOUNTER — Telehealth: Payer: Self-pay

## 2022-08-07 NOTE — Telephone Encounter (Addendum)
Spoke with pt and pt states she will come on 7/12 to sign statement after she has preop appt at 8am  ----- Message from Lequire B sent at 08/07/2022  4:25 PM EDT ----- Regarding: NEEDS HYSTERECTOMY STATEMENT ASAP - SURGERY 7/16 This patient has [employer] which is a new managed medicaid plan, she will need a hysterectomy statement ASAP (by Friday 7/12) or her surgery will need to be cancelled

## 2022-08-08 ENCOUNTER — Encounter (HOSPITAL_COMMUNITY)
Admission: RE | Admit: 2022-08-08 | Discharge: 2022-08-08 | Disposition: A | Discharge status: Home / Self Care | Payer: 59 | Source: Ambulatory Visit | Attending: Obstetrics and Gynecology | Admitting: Obstetrics and Gynecology

## 2022-08-08 DIAGNOSIS — Z01818 Encounter for other preprocedural examination: Secondary | ICD-10-CM | POA: Insufficient documentation

## 2022-08-11 NOTE — Anesthesia Preprocedure Evaluation (Addendum)
Anesthesia Evaluation  Patient identified by MRN, date of birth, ID band Patient awake    Reviewed: Allergy & Precautions, NPO status , Patient's Chart, lab work & pertinent test results  Airway Mallampati: II  TM Distance: >3 FB Neck ROM: Full    Dental no notable dental hx. (+) Teeth Intact, Dental Advisory Given   Pulmonary    Pulmonary exam normal breath sounds clear to auscultation       Cardiovascular hypertension, Pt. on medications Normal cardiovascular exam Rhythm:Regular Rate:Normal     Neuro/Psych  Headaches PSYCHIATRIC DISORDERS Anxiety Depression       GI/Hepatic   Endo/Other    Renal/GU negative Renal ROSLab Results      Component                Value               Date                     K                        4.0                 07/24/2022                    Musculoskeletal   Abdominal   Peds  Hematology Lab Results      Component                Value               Date                      WBC                      4.5                 07/24/2022                HGB                      10.0 (L)            07/24/2022                HCT                      32.9 (L)            07/24/2022                MCV                      76.7 (L)            07/24/2022                PLT                      403 (H)             07/24/2022              Anesthesia Other Findings All: latex Cephalexin  Reproductive/Obstetrics negative OB ROS  Anesthesia Physical Anesthesia Plan  ASA: 2  Anesthesia Plan: General   Post-op Pain Management: Ketamine IV*, Toradol IV (intra-op)* and Tylenol PO (pre-op)*   Induction: Intravenous  PONV Risk Score and Plan: Treatment may vary due to age or medical condition, Midazolam, Ondansetron and Dexamethasone  Airway Management Planned: Oral ETT  Additional Equipment: None  Intra-op Plan:   Post-operative Plan:  Extubation in OR  Informed Consent: I have reviewed the patients History and Physical, chart, labs and discussed the procedure including the risks, benefits and alternatives for the proposed anesthesia with the patient or authorized representative who has indicated his/her understanding and acceptance.     Dental advisory given  Plan Discussed with: CRNA  Anesthesia Plan Comments: (Hysterectomy for abnormal uterine bleeding Fibroids)         Anesthesia Quick Evaluation

## 2022-08-12 ENCOUNTER — Ambulatory Visit (HOSPITAL_BASED_OUTPATIENT_CLINIC_OR_DEPARTMENT_OTHER)
Admission: RE | Admit: 2022-08-12 | Discharge: 2022-08-12 | Disposition: A | Discharge status: Home / Self Care | Payer: 59 | Attending: Obstetrics and Gynecology | Admitting: Obstetrics and Gynecology

## 2022-08-12 ENCOUNTER — Ambulatory Visit (HOSPITAL_BASED_OUTPATIENT_CLINIC_OR_DEPARTMENT_OTHER): Payer: 59 | Admitting: Anesthesiology

## 2022-08-12 ENCOUNTER — Other Ambulatory Visit: Payer: Self-pay

## 2022-08-12 ENCOUNTER — Encounter (HOSPITAL_BASED_OUTPATIENT_CLINIC_OR_DEPARTMENT_OTHER): Payer: Self-pay | Admitting: Obstetrics and Gynecology

## 2022-08-12 ENCOUNTER — Encounter (HOSPITAL_BASED_OUTPATIENT_CLINIC_OR_DEPARTMENT_OTHER)
Admission: RE | Disposition: A | Discharge status: Home / Self Care | Payer: Self-pay | Source: Home / Self Care | Attending: Obstetrics and Gynecology

## 2022-08-12 DIAGNOSIS — N946 Dysmenorrhea, unspecified: Secondary | ICD-10-CM | POA: Diagnosis not present

## 2022-08-12 DIAGNOSIS — N939 Abnormal uterine and vaginal bleeding, unspecified: Secondary | ICD-10-CM | POA: Diagnosis not present

## 2022-08-12 DIAGNOSIS — N8003 Adenomyosis of the uterus: Secondary | ICD-10-CM | POA: Insufficient documentation

## 2022-08-12 DIAGNOSIS — D250 Submucous leiomyoma of uterus: Secondary | ICD-10-CM

## 2022-08-12 DIAGNOSIS — D252 Subserosal leiomyoma of uterus: Secondary | ICD-10-CM

## 2022-08-12 DIAGNOSIS — D219 Benign neoplasm of connective and other soft tissue, unspecified: Secondary | ICD-10-CM | POA: Diagnosis present

## 2022-08-12 DIAGNOSIS — N72 Inflammatory disease of cervix uteri: Secondary | ICD-10-CM | POA: Insufficient documentation

## 2022-08-12 DIAGNOSIS — D251 Intramural leiomyoma of uterus: Secondary | ICD-10-CM | POA: Insufficient documentation

## 2022-08-12 DIAGNOSIS — Z01818 Encounter for other preprocedural examination: Secondary | ICD-10-CM

## 2022-08-12 HISTORY — PX: TOTAL LAPAROSCOPIC HYSTERECTOMY WITH SALPINGECTOMY: SHX6742

## 2022-08-12 HISTORY — PX: CYSTOSCOPY: SHX5120

## 2022-08-12 HISTORY — DX: Disorder of the skin and subcutaneous tissue, unspecified: L98.9

## 2022-08-12 HISTORY — DX: Anemia, unspecified: D64.9

## 2022-08-12 HISTORY — DX: Benign lipomatous neoplasm, unspecified: D17.9

## 2022-08-12 LAB — HEMOGLOBIN: Hemoglobin: 11 g/dL — ABNORMAL LOW (ref 12.0–15.0)

## 2022-08-12 LAB — TYPE AND SCREEN
ABO/RH(D): O POS
Antibody Screen: NEGATIVE

## 2022-08-12 LAB — POCT PREGNANCY, URINE: Preg Test, Ur: NEGATIVE

## 2022-08-12 SURGERY — HYSTERECTOMY, TOTAL, LAPAROSCOPIC, WITH SALPINGECTOMY
Anesthesia: General | Site: Urethra

## 2022-08-12 MED ORDER — ROCURONIUM BROMIDE 10 MG/ML (PF) SYRINGE
PREFILLED_SYRINGE | INTRAVENOUS | Status: DC | PRN
  Administered 2022-08-12: 70 mg via INTRAVENOUS

## 2022-08-12 MED ORDER — KETOROLAC TROMETHAMINE 30 MG/ML IJ SOLN
INTRAMUSCULAR | Status: AC
  Filled 2022-08-12: qty 1

## 2022-08-12 MED ORDER — SIMETHICONE 80 MG PO CHEW
CHEWABLE_TABLET | ORAL | Status: AC
  Filled 2022-08-12: qty 1

## 2022-08-12 MED ORDER — ROCURONIUM BROMIDE 10 MG/ML (PF) SYRINGE
PREFILLED_SYRINGE | INTRAVENOUS | Status: AC
  Filled 2022-08-12: qty 10

## 2022-08-12 MED ORDER — ACETAMINOPHEN 500 MG PO TABS
1000.0000 mg | ORAL_TABLET | ORAL | Status: AC
  Administered 2022-08-12: 1000 mg via ORAL

## 2022-08-12 MED ORDER — FENTANYL CITRATE (PF) 100 MCG/2ML IJ SOLN
INTRAMUSCULAR | Status: DC | PRN
  Administered 2022-08-12 (×2): 50 ug via INTRAVENOUS

## 2022-08-12 MED ORDER — KETOROLAC TROMETHAMINE 30 MG/ML IJ SOLN
30.0000 mg | Freq: Four times a day (QID) | INTRAMUSCULAR | Status: DC
  Administered 2022-08-12: 30 mg via INTRAVENOUS

## 2022-08-12 MED ORDER — GABAPENTIN 100 MG PO CAPS: 200.0000 mg | ORAL_CAPSULE | Freq: Two times a day (BID) | ORAL | 0 refills | Status: DC

## 2022-08-12 MED ORDER — HYDROMORPHONE HCL 1 MG/ML IJ SOLN
INTRAMUSCULAR | Status: AC
  Filled 2022-08-12: qty 1

## 2022-08-12 MED ORDER — PANTOPRAZOLE SODIUM 40 MG PO TBEC
DELAYED_RELEASE_TABLET | ORAL | Status: AC
  Filled 2022-08-12: qty 1

## 2022-08-12 MED ORDER — LACTATED RINGERS IV SOLN: INTRAVENOUS | Status: DC

## 2022-08-12 MED ORDER — PHENYLEPHRINE 80 MCG/ML (10ML) SYRINGE FOR IV PUSH (FOR BLOOD PRESSURE SUPPORT)
PREFILLED_SYRINGE | INTRAVENOUS | Status: AC
  Filled 2022-08-12: qty 10

## 2022-08-12 MED ORDER — HEMOSTATIC AGENTS (NO CHARGE) OPTIME
TOPICAL | Status: DC | PRN
  Administered 2022-08-12: 1 via TOPICAL

## 2022-08-12 MED ORDER — OXYCODONE HCL 5 MG PO TABS: 5.0000 mg | ORAL_TABLET | Freq: Once | ORAL | Status: DC | PRN

## 2022-08-12 MED ORDER — PHENYLEPHRINE 80 MCG/ML (10ML) SYRINGE FOR IV PUSH (FOR BLOOD PRESSURE SUPPORT)
PREFILLED_SYRINGE | INTRAVENOUS | Status: DC | PRN
  Administered 2022-08-12: 80 ug via INTRAVENOUS

## 2022-08-12 MED ORDER — POVIDONE-IODINE 10 % EX SWAB
2.0000 | Freq: Once | CUTANEOUS | Status: AC
  Administered 2022-08-12: 2 via TOPICAL

## 2022-08-12 MED ORDER — HYDROMORPHONE HCL 1 MG/ML IJ SOLN
0.2500 mg | INTRAMUSCULAR | Status: DC | PRN
  Administered 2022-08-12 (×2): 0.25 mg via INTRAVENOUS

## 2022-08-12 MED ORDER — MIDAZOLAM HCL 5 MG/5ML IJ SOLN
INTRAMUSCULAR | Status: DC | PRN
  Administered 2022-08-12: 2 mg via INTRAVENOUS

## 2022-08-12 MED ORDER — OXYCODONE HCL 5 MG/5ML PO SOLN: 5.0000 mg | Freq: Once | ORAL | Status: DC | PRN

## 2022-08-12 MED ORDER — KETOROLAC TROMETHAMINE 30 MG/ML IJ SOLN
INTRAMUSCULAR | Status: DC | PRN
  Administered 2022-08-12: 30 mg via INTRAVENOUS

## 2022-08-12 MED ORDER — KETOROLAC TROMETHAMINE 30 MG/ML IJ SOLN: 30.0000 mg | Freq: Once | INTRAMUSCULAR | Status: DC | PRN

## 2022-08-12 MED ORDER — KETAMINE HCL 50 MG/5ML IJ SOSY
PREFILLED_SYRINGE | INTRAMUSCULAR | Status: AC
  Filled 2022-08-12: qty 5

## 2022-08-12 MED ORDER — ACETAMINOPHEN 500 MG PO TABS
ORAL_TABLET | ORAL | Status: AC
  Filled 2022-08-12: qty 2

## 2022-08-12 MED ORDER — SUGAMMADEX SODIUM 200 MG/2ML IV SOLN
INTRAVENOUS | Status: DC | PRN
  Administered 2022-08-12: 200 mg via INTRAVENOUS

## 2022-08-12 MED ORDER — OXYCODONE HCL 5 MG PO TABS
ORAL_TABLET | ORAL | Status: AC
  Filled 2022-08-12: qty 1

## 2022-08-12 MED ORDER — ONDANSETRON HCL 4 MG/2ML IJ SOLN: 4.0000 mg | Freq: Once | INTRAMUSCULAR | Status: DC | PRN

## 2022-08-12 MED ORDER — POLYETHYLENE GLYCOL 3350 17 G PO PACK: 17.0000 g | PACK | Freq: Every day | ORAL | Status: DC | PRN

## 2022-08-12 MED ORDER — ONDANSETRON HCL 4 MG/2ML IJ SOLN
INTRAMUSCULAR | Status: DC | PRN
  Administered 2022-08-12: 4 mg via INTRAVENOUS

## 2022-08-12 MED ORDER — SOD CITRATE-CITRIC ACID 500-334 MG/5ML PO SOLN: 30.0000 mL | ORAL | Status: DC

## 2022-08-12 MED ORDER — DEXAMETHASONE SODIUM PHOSPHATE 10 MG/ML IJ SOLN
INTRAMUSCULAR | Status: AC
  Filled 2022-08-12: qty 1

## 2022-08-12 MED ORDER — ZOLPIDEM TARTRATE 5 MG PO TABS: 5.0000 mg | ORAL_TABLET | Freq: Every evening | ORAL | Status: DC | PRN

## 2022-08-12 MED ORDER — CEFAZOLIN SODIUM-DEXTROSE 2-4 GM/100ML-% IV SOLN
2.0000 g | Freq: Once | INTRAVENOUS | Status: AC
  Administered 2022-08-12: 2 g via INTRAVENOUS

## 2022-08-12 MED ORDER — DEXMEDETOMIDINE HCL IN NACL 80 MCG/20ML IV SOLN
INTRAVENOUS | Status: AC
  Filled 2022-08-12: qty 20

## 2022-08-12 MED ORDER — AMISULPRIDE (ANTIEMETIC) 5 MG/2ML IV SOLN
10.0000 mg | Freq: Once | INTRAVENOUS | Status: AC
  Administered 2022-08-12: 10 mg via INTRAVENOUS

## 2022-08-12 MED ORDER — SODIUM CHLORIDE 0.9 % IR SOLN
Status: DC | PRN
  Administered 2022-08-12 (×2): 1000 mL

## 2022-08-12 MED ORDER — GABAPENTIN 100 MG PO CAPS
ORAL_CAPSULE | ORAL | Status: AC
  Filled 2022-08-12: qty 1

## 2022-08-12 MED ORDER — IBUPROFEN 200 MG PO TABS: 600.0000 mg | ORAL_TABLET | Freq: Four times a day (QID) | ORAL | Status: DC

## 2022-08-12 MED ORDER — PANTOPRAZOLE SODIUM 40 MG PO TBEC
40.0000 mg | DELAYED_RELEASE_TABLET | Freq: Every day | ORAL | Status: DC
  Administered 2022-08-12: 40 mg via ORAL

## 2022-08-12 MED ORDER — ONDANSETRON HCL 4 MG PO TABS: 4.0000 mg | ORAL_TABLET | Freq: Four times a day (QID) | ORAL | Status: DC | PRN

## 2022-08-12 MED ORDER — ONDANSETRON 4 MG PO TBDP: 4.0000 mg | ORAL_TABLET | Freq: Four times a day (QID) | ORAL | 0 refills | Status: DC | PRN

## 2022-08-12 MED ORDER — KETAMINE HCL 10 MG/ML IJ SOLN
INTRAMUSCULAR | Status: DC | PRN
  Administered 2022-08-12 (×2): 10 mg via INTRAVENOUS
  Administered 2022-08-12: 15 mg via INTRAVENOUS
  Administered 2022-08-12: 10 mg via INTRAVENOUS
  Administered 2022-08-12: 5 mg via INTRAVENOUS

## 2022-08-12 MED ORDER — GABAPENTIN 100 MG PO CAPS
100.0000 mg | ORAL_CAPSULE | Freq: Two times a day (BID) | ORAL | Status: DC
  Administered 2022-08-12: 100 mg via ORAL

## 2022-08-12 MED ORDER — ONDANSETRON HCL 4 MG/2ML IJ SOLN
INTRAMUSCULAR | Status: AC
  Filled 2022-08-12: qty 2

## 2022-08-12 MED ORDER — MENTHOL 3 MG MT LOZG: 1.0000 | LOZENGE | OROMUCOSAL | Status: DC | PRN

## 2022-08-12 MED ORDER — IBUPROFEN 600 MG PO TABS: 600.0000 mg | ORAL_TABLET | Freq: Four times a day (QID) | ORAL | 2 refills | Status: DC | PRN

## 2022-08-12 MED ORDER — FENTANYL CITRATE (PF) 100 MCG/2ML IJ SOLN
INTRAMUSCULAR | Status: AC
  Filled 2022-08-12: qty 2

## 2022-08-12 MED ORDER — 0.9 % SODIUM CHLORIDE (POUR BTL) OPTIME
TOPICAL | Status: DC | PRN
  Administered 2022-08-12: 500 mL

## 2022-08-12 MED ORDER — HYDROCODONE-ACETAMINOPHEN 5-325 MG PO TABS: 1.0000 | ORAL_TABLET | Freq: Four times a day (QID) | ORAL | 0 refills | Status: DC | PRN

## 2022-08-12 MED ORDER — PROPOFOL 10 MG/ML IV BOLUS
INTRAVENOUS | Status: DC | PRN
Start: 2022-08-12 — End: 2022-08-12
  Administered 2022-08-12: 180 mg via INTRAVENOUS

## 2022-08-12 MED ORDER — MIDAZOLAM HCL 2 MG/2ML IJ SOLN
INTRAMUSCULAR | Status: AC
  Filled 2022-08-12: qty 2

## 2022-08-12 MED ORDER — DEXMEDETOMIDINE HCL IN NACL 80 MCG/20ML IV SOLN
INTRAVENOUS | Status: DC | PRN
  Administered 2022-08-12 (×3): 4 ug via INTRAVENOUS

## 2022-08-12 MED ORDER — OXYCODONE HCL 5 MG PO TABS
5.0000 mg | ORAL_TABLET | ORAL | Status: DC | PRN
  Administered 2022-08-12 (×2): 5 mg via ORAL

## 2022-08-12 MED ORDER — AMISULPRIDE (ANTIEMETIC) 5 MG/2ML IV SOLN
INTRAVENOUS | Status: AC
  Filled 2022-08-12: qty 4

## 2022-08-12 MED ORDER — BUPIVACAINE HCL 0.5 % IJ SOLN
INTRAMUSCULAR | Status: DC | PRN
  Administered 2022-08-12: 15 mL

## 2022-08-12 MED ORDER — ONDANSETRON HCL 4 MG/2ML IJ SOLN: 4.0000 mg | Freq: Four times a day (QID) | INTRAMUSCULAR | Status: DC | PRN

## 2022-08-12 MED ORDER — SIMETHICONE 80 MG PO CHEW
80.0000 mg | CHEWABLE_TABLET | Freq: Four times a day (QID) | ORAL | Status: DC | PRN
  Administered 2022-08-12: 80 mg via ORAL

## 2022-08-12 MED ORDER — DEXAMETHASONE SODIUM PHOSPHATE 10 MG/ML IJ SOLN
INTRAMUSCULAR | Status: DC | PRN
  Administered 2022-08-12: 10 mg via INTRAVENOUS

## 2022-08-12 MED ORDER — LIDOCAINE 2% (20 MG/ML) 5 ML SYRINGE
INTRAMUSCULAR | Status: DC | PRN
  Administered 2022-08-12: 80 mg via INTRAVENOUS

## 2022-08-12 MED ORDER — CEFAZOLIN SODIUM-DEXTROSE 2-4 GM/100ML-% IV SOLN
INTRAVENOUS | Status: AC
  Filled 2022-08-12: qty 100

## 2022-08-12 MED ORDER — LIDOCAINE HCL (PF) 2 % IJ SOLN
INTRAMUSCULAR | Status: AC
  Filled 2022-08-12: qty 5

## 2022-08-12 MED ORDER — PROPOFOL 10 MG/ML IV BOLUS
INTRAVENOUS | Status: AC
  Filled 2022-08-12: qty 20

## 2022-08-12 SURGICAL SUPPLY — 55 items
ADH SKN CLS APL DERMABOND .7 (GAUZE/BANDAGES/DRESSINGS) ×2
APL PRP STRL LF DISP 70% ISPRP (MISCELLANEOUS) ×2
APL SRG 38 LTWT LNG FL B (MISCELLANEOUS) ×2
APPLICATOR ARISTA FLEXITIP XL (MISCELLANEOUS) IMPLANT
CHLORAPREP W/TINT 26 (MISCELLANEOUS) ×2 IMPLANT
COVER MAYO STAND STRL (DRAPES) ×2 IMPLANT
COVER SURGICAL LIGHT HANDLE (MISCELLANEOUS) ×2 IMPLANT
DERMABOND ADVANCED .7 DNX12 (GAUZE/BANDAGES/DRESSINGS) ×2 IMPLANT
DRAPE SURG IRRIG POUCH 19X23 (DRAPES) ×2 IMPLANT
GLOVE BIOGEL PI IND STRL 7.0 (GLOVE) IMPLANT
GLOVE SURG NEOP MICRO LF SZ6.5 (GLOVE) IMPLANT
GLOVE SURG SS PI 6.0 STRL IVOR (GLOVE) IMPLANT
GLOVE SURG SS PI 7.0 STRL IVOR (GLOVE) IMPLANT
GOWN STRL REUS W/TWL LRG LVL3 (GOWN DISPOSABLE) ×8 IMPLANT
GOWN STRL REUS W/TWL XL LVL3 (GOWN DISPOSABLE) IMPLANT
HEMOSTAT ARISTA ABSORB 3G PWDR (HEMOSTASIS) IMPLANT
IRRIG SUCT STRYKERFLOW 2 WTIP (MISCELLANEOUS) ×2
IRRIGATION SUCT STRKRFLW 2 WTP (MISCELLANEOUS) ×2 IMPLANT
IV NS 1000ML (IV SOLUTION) ×4
IV NS 1000ML BAXH (IV SOLUTION) IMPLANT
KIT PINK PAD W/HEAD ARE REST (MISCELLANEOUS) ×2
KIT PINK PAD W/HEAD ARM REST (MISCELLANEOUS) ×2 IMPLANT
KIT TURNOVER CYSTO (KITS) ×2 IMPLANT
L-HOOK LAP DISP 36CM (ELECTROSURGICAL) ×2
LEGGING LITHOTOMY PAIR STRL (DRAPES) IMPLANT
LHOOK LAP DISP 36CM (ELECTROSURGICAL) ×2 IMPLANT
MANIPULATOR ADVINCU DEL 3.5 PL (MISCELLANEOUS) IMPLANT
NDL INSUFFLATION 14GA 120MM (NEEDLE) ×2 IMPLANT
NEEDLE INSUFFLATION 14GA 120MM (NEEDLE) ×2 IMPLANT
NS IRRIG 500ML POUR BTL (IV SOLUTION) IMPLANT
PACK LAPAROSCOPY BASIN (CUSTOM PROCEDURE TRAY) ×2 IMPLANT
PENCIL BUTTON HOLSTER BLD 10FT (ELECTRODE) ×2 IMPLANT
POUCH LAPAROSCOPIC INSTRUMENT (MISCELLANEOUS) ×2 IMPLANT
PROTECTOR NERVE ULNAR (MISCELLANEOUS) ×4 IMPLANT
SCRUB CHG 4% DYNA-HEX 4OZ (MISCELLANEOUS) ×2 IMPLANT
SEALER TISSUE G2 CVD JAW 35 (ENDOMECHANICALS) ×2 IMPLANT
SET IRRIG Y TYPE TUR BLADDER L (SET/KITS/TRAYS/PACK) ×2 IMPLANT
SET TUBE SMOKE EVAC HIGH FLOW (TUBING) ×2 IMPLANT
SLEEVE ADV FIXATION 5X100MM (TROCAR) ×4 IMPLANT
SLEEVE SCD COMPRESS KNEE MED (STOCKING) ×2 IMPLANT
SPIKE FLUID TRANSFER (MISCELLANEOUS) IMPLANT
SUT MNCRL AB 4-0 PS2 18 (SUTURE) IMPLANT
SUT VIC AB 0 CT1 27 (SUTURE) ×2
SUT VIC AB 0 CT1 27XBRD ANBCTR (SUTURE) ×2 IMPLANT
SUT VIC AB 4-0 PS2 27 (SUTURE) ×4 IMPLANT
SUT VLOC 180 0 9IN GS21 (SUTURE) ×2 IMPLANT
SYR 10ML LL (SYRINGE) ×2 IMPLANT
SYR 50ML LL SCALE MARK (SYRINGE) ×2 IMPLANT
TOWEL OR 17X24 6PK STRL BLUE (TOWEL DISPOSABLE) ×2 IMPLANT
TRAY FOLEY W/BAG SLVR 14FR LF (SET/KITS/TRAYS/PACK) ×2 IMPLANT
TROCAR ADV FIXATION 5X100MM (TROCAR) ×2 IMPLANT
TROCAR KII 8X100ML NONTHREADED (TROCAR) ×2 IMPLANT
TROCAR Z-THREAD FIOS 11X100 BL (TROCAR) IMPLANT
UNDERPAD 30X36 HEAVY ABSORB (UNDERPADS AND DIAPERS) ×2 IMPLANT
WARMER LAPAROSCOPE (MISCELLANEOUS) ×2 IMPLANT

## 2022-08-12 NOTE — Op Note (Signed)
Ardie Pritchard PROCEDURE DATE: 08/12/2022  PREOPERATIVE DIAGNOSIS: Fibroids, heavy bleeding, painful periods POSTOPERATIVE DIAGNOSIS: The same PROCEDURE: Total laparoscopic hysterectomy, bilateral salpingoectomy, cystoscopy SURGEON:  Dr. Milas Hock ASSISTANT:  Dr. Briscoe Deutscher.  An experienced assistant was required given the standard of surgical care given the complexity of the case.  This assistant was needed for exposure, dissection, suctioning, retraction, instrument exchange, and for overall help during the procedure.  INDICATIONS: 42 y.o. [id number]  here for definitive surgical management secondary to the indications listed under preoperative diagnoses; please see preoperative note for further details.  Risks of surgery were discussed with the patient including but not limited to: bleeding which may require transfusion or reoperation; infection which may require antibiotics; injury to bowel, bladder, ureters or other surrounding organs; need for additional procedures; thromboembolic phenomenon, incisional problems and other postoperative/anesthesia complications. Written informed consent was obtained.    FINDINGS:  Normal appearing EFG. Normal vagina and cervix. Uterus with multiple fibroids. Prior evidence of endometriosis on the right uterosacral ligament. On superficial survey, normal abdominal cavity. Normal appearing ovaries and tubes. Normal appearing bladder with no injury or sutures. Bilateral brisk ureteral jets.  ANESTHESIA:    General INTRAVENOUS FLUIDS: 900  ml ESTIMATED BLOOD LOSS: 50 ml URINE OUTPUT: 400 ml   SPECIMENS: Uterus, cervix, bilateral fallopian tubes COMPLICATIONS: None immediate  PROCEDURE IN DETAIL:  The patient received intravenous antibiotics and had sequential compression devices applied to her lower extremities while in the preoperative area.  She was then taken to the operating room where general anesthesia was administered and was found to be adequate.  She was  placed in the dorsal lithotomy position, and was prepped and draped in a sterile manner.    After an adequate timeout was performed, an avincula uterine manipulator was placed at this time.  A Foley catheter was inserted into her bladder and attached to constant drainage.   Attention turned to patient's abdomen. Skin incision made with the scalpel in the umbilicus and closed technique with an optiview entry.  Pneumoperitoneum achieved to a pressure of 15.  0 degree laparoscope was used with the 5mm optiview port and trocar to enter the cavity under direct visualization. Inspection below the point of entry revealed no evidence of bowel injury. Patient placed in steep trendelenburg. Lateral 5 mm ports were placed as well as the 8mm suprapubic port.   The pelvis was then carefully examined.  Attention was turned to the fallopian tubes; these were freed from the underlying mesosalpinx and the uterine attachments using the Enseal device.  The bilateral round and broad ligaments were then clamped and transected with the Enseal device.  The uterine artery was then skeletonized and a bladder flap was created.  The ureters were noted to be safely away from the area of dissection.  The bladder was then bluntly dissected off the lower uterine segment.  At this point, attention was turned to the uterine vessels, which were clamped and ligated using the Enseal.  Good hemostasis was noted overall.  The uterosacral and cardinal ligaments were clamped, cut and ligated bilaterally.  Attention was then turned to the cervicovaginal junction, and the bovie hook was used to transect the cervix from the surrounding vagina using the ring of the avincula as a guide. This was done circumferentially allowing total hysterectomy.  The uterus was then removed from the vagina and the vaginal cuff incision was then closed with 2-0 vlock.  Overall excellent hemostasis was noted.  The ureters were reexamined bilaterally and were  pulsating  normally. The abdominal pressure was reduced and hemostasis was confirmed.     Cystoscopy showed bilateral ureteral jets.  No stitches were visualized in the bladder during cystoscopy.  The bladder was then drained and the foley was not replaced.   Abdomen was once again inspected and hemostatic. Arista applied and no evidence of bleeding still noted. All trocars were removed and the abdomen was desufflated. All skin incisions were closed with 4-0 monocryl subcuticular stitches and Dermabond. The patient tolerated the procedures well.    All instruments, needles, and sponge counts were correct x 2. The patient was taken to the recovery room awake, extubated and in stable condition.    Milas Hock, MD, FACOG Obstetrician & Gynecologist, [facility] for [facility], [facility]

## 2022-08-12 NOTE — Transfer of Care (Signed)
Immediate Anesthesia Transfer of Care Note  Patient: Daisy [name]  Procedure(s) Performed: TOTAL LAPAROSCOPIC HYSTERECTOMY WITH SALPINGECTOMY (Abdomen) CYSTOSCOPY (Urethra)  Patient Location: PACU  Anesthesia Type:General  Level of Consciousness: awake, alert , and oriented  Airway & Oxygen Therapy: Patient Spontanous Breathing and Patient connected to nasal cannula oxygen  Post-op Assessment: Report given to RN and Post -op Vital signs reviewed and stable  Post vital signs: Reviewed and stable  Last Vitals:  Vitals Value Taken Time  BP 150/105 08/12/22 0932  Temp 37.2 C 08/12/22 0930  Pulse 91 08/12/22 0940  Resp 16 08/12/22 0940  SpO2 100 % 08/12/22 0940  Vitals shown include unfiled device data.  Last Pain:  Vitals:   08/12/22 0930  TempSrc:   PainSc: Asleep      Patients Stated Pain Goal: 4 (08/12/22 0553)  Complications: No notable events documented.

## 2022-08-12 NOTE — Interval H&P Note (Signed)
History and Physical Interval Note:  08/12/2022 7:16 AM  Daisy [name]  has presented today for surgery, with the diagnosis of ABNORMAL UTERINE BLEEDING;FIBROIDS.  The various methods of treatment have been discussed with the patient and family. After consideration of risks, benefits and other options for treatment, the patient has consented to  Procedure(s): TOTAL LAPAROSCOPIC HYSTERECTOMY WITH SALPINGECTOMY (N/A) CYSTOSCOPY (N/A) as a surgical intervention.  The patient's history has been reviewed, patient examined, no change in status, stable for surgery.  I have reviewed the patient's chart and labs.  Questions were answered to the patient's satisfaction.     Milas Hock

## 2022-08-12 NOTE — Anesthesia Postprocedure Evaluation (Signed)
Anesthesia Post Note  Patient: Production manager  Procedure(s) Performed: TOTAL LAPAROSCOPIC HYSTERECTOMY WITH SALPINGECTOMY (Abdomen) CYSTOSCOPY (Urethra)     Patient location during evaluation: PACU Anesthesia Type: General Level of consciousness: awake and alert Pain management: pain level controlled Vital Signs Assessment: post-procedure vital signs reviewed and stable Respiratory status: spontaneous breathing, nonlabored ventilation, respiratory function stable and patient connected to nasal cannula oxygen Cardiovascular status: blood pressure returned to baseline and stable Postop Assessment: no apparent nausea or vomiting Anesthetic complications: no   No notable events documented.  Last Vitals:  Vitals:   08/12/22 1107 08/12/22 1201  BP: (!) 141/93 (!) 128/99  Pulse: 81 91  Resp: 16   Temp: 37.2 C 37.1 C  SpO2: 97% 100%    Last Pain:  Vitals:   08/12/22 1155  TempSrc:   PainSc: Asleep                 Trevor Iha

## 2022-08-12 NOTE — Anesthesia Procedure Notes (Signed)
Procedure Name: Intubation Date/Time: 08/12/2022 7:37 AM  Performed by: Shawonda Kerce D, CRNAPre-anesthesia Checklist: Patient identified, Emergency Drugs available, Suction available and Patient being monitored Patient Re-evaluated:Patient Re-evaluated prior to induction Oxygen Delivery Method: Circle system utilized Preoxygenation: Pre-oxygenation with 100% oxygen Induction Type: IV induction Ventilation: Mask ventilation without difficulty Laryngoscope Size: Mac and 3 Grade View: Grade I Tube type: Oral Tube size: 7.0 mm Number of attempts: 1 Airway Equipment and Method: Stylet and Oral airway Placement Confirmation: ETT inserted through vocal cords under direct vision, positive ETCO2 and breath sounds checked- equal and bilateral Secured at: 20 cm Tube secured with: Tape Dental Injury: Teeth and Oropharynx as per pre-operative assessment

## 2022-08-13 ENCOUNTER — Encounter (HOSPITAL_BASED_OUTPATIENT_CLINIC_OR_DEPARTMENT_OTHER): Payer: Self-pay | Admitting: Obstetrics and Gynecology

## 2022-08-13 ENCOUNTER — Telehealth: Payer: Self-pay | Admitting: *Deleted

## 2022-08-13 LAB — SURGICAL PATHOLOGY

## 2022-08-13 NOTE — Telephone Encounter (Signed)
Left patient a message with scheduled appointment information. 

## 2022-08-24 ENCOUNTER — Other Ambulatory Visit: Payer: Self-pay | Admitting: Medical-Surgical

## 2022-08-24 DIAGNOSIS — I10 Essential (primary) hypertension: Secondary | ICD-10-CM

## 2022-09-05 NOTE — Progress Notes (Deleted)
   GYNECOLOGY OFFICE VISIT NOTE  History:   Daisy [name] is a 42 y.o. [id number] here today for postop check following TLH/BS/cystoscopy on 08/12/22. Her surgery was without complication. This is her 4 wk check up. Since her surgery she has felt ***. .   She denies any abnormal vaginal discharge, bleeding, pelvic pain or other concerns.     Past Medical History:  Diagnosis Date   Anemia    Anxiety    Depression    Hypertension    Lipoma on neck    5 to 6 inches in size   Migraine    Skin abnormality    slow healer with scars etc    Past Surgical History:  Procedure Laterality Date   CYSTOSCOPY N/A 08/12/2022   Procedure: CYSTOSCOPY;  Surgeon: Milas Hock, MD;  Location: [facility];  Service: Gynecology;  Laterality: N/A;   ENDOMETRIAL BIOPSY  07/07/2022   NO PAST SURGERIES     TOTAL LAPAROSCOPIC HYSTERECTOMY WITH SALPINGECTOMY N/A 08/12/2022   Procedure: TOTAL LAPAROSCOPIC HYSTERECTOMY WITH SALPINGECTOMY;  Surgeon: Milas Hock, MD;  Location: [facility] [place];  Service: Gynecology;  Laterality: N/A;    The following portions of the patient's history were reviewed and updated as appropriate: allergies, current medications, past family history, past medical history, past social history, past surgical history and problem list.   Health Maintenance:    Diagnosis  Date Value Ref Range Status  07/07/2022 - Benign reactive/reparative changes  Final  07/07/2022   Final   - Negative for Intraepithelial Lesions or Malignancy (NILM)    Review of Systems:  Pertinent items noted in HPI and remainder of comprehensive ROS otherwise negative.  Physical Exam:  There were no vitals taken for this visit. CONSTITUTIONAL: Well-developed, well-nourished female in no acute distress.  HEENT:  Normocephalic, atraumatic. External right and left ear normal. No scleral icterus.  NECK: Normal range of motion, supple, no masses noted on observation SKIN: No rash noted.  Not diaphoretic. No erythema. No pallor. MUSCULOSKELETAL: Normal range of motion. No edema noted. NEUROLOGIC: Alert and oriented to person, place, and time. Normal muscle tone coordination. No cranial nerve deficit noted. PSYCHIATRIC: Normal mood and affect. Normal behavior. Normal judgment and thought content.  ABDOMEN: l/s incisions healing well *** PELVIC: Cuff healing well  Labs and Imaging No results found for this or any previous visit (from the past 168 hour(s)). No results found.  Assessment and Plan:   1. Postop check May return to work Not yet cleared for intercourse - will recheck at 12 weeks postop.  Okay for pool/baths    Diagnoses and all orders for this visit:  Postop check     No orders of the defined types were placed in this encounter.    Routine preventative health maintenance measures emphasized. Please refer to After Visit Summary for other counseling recommendations.   No follow-ups on file.  Milas Hock, MD, FACOG Obstetrician & Gynecologist, [facility] for [facility], [facility]

## 2022-09-08 ENCOUNTER — Telehealth: Payer: Self-pay | Admitting: *Deleted

## 2022-09-08 ENCOUNTER — Encounter: Payer: 59 | Admitting: Obstetrics and Gynecology

## 2022-09-08 DIAGNOSIS — Z1231 Encounter for screening mammogram for malignant neoplasm of breast: Secondary | ICD-10-CM

## 2022-09-08 DIAGNOSIS — Z09 Encounter for follow-up examination after completed treatment for conditions other than malignant neoplasm: Secondary | ICD-10-CM

## 2022-09-08 NOTE — Telephone Encounter (Signed)
Left patient a message to see if she is going to make it to her 1:50 PM appointment before it is to late to be seen.

## 2022-09-10 ENCOUNTER — Ambulatory Visit (INDEPENDENT_AMBULATORY_CARE_PROVIDER_SITE_OTHER): Payer: 59 | Admitting: Plastic Surgery

## 2022-09-10 DIAGNOSIS — D489 Neoplasm of uncertain behavior, unspecified: Secondary | ICD-10-CM

## 2022-09-10 NOTE — Progress Notes (Signed)
Spoke with Daisy [name] by phone this morning.  I confirmed her identity through name and birthday and she confirmed that she was in her home and I was in my office at the time that we spoke.  We spoke for approximately 3 minutes.  We reviewed her ultrasound which showed a soft tissue density most likely a lipoma at the site of her previous excision.  She is still interested in having this removed.  I have recommended that the excision be done in the operating room since this is a reexcision and on the back.  She is in agreement.  Will schedule for surgery.  Will need to obtain photographs at the time of her preop appointment.

## 2022-09-11 ENCOUNTER — Encounter: Payer: 59 | Admitting: Obstetrics and Gynecology

## 2022-09-11 ENCOUNTER — Ambulatory Visit (INDEPENDENT_AMBULATORY_CARE_PROVIDER_SITE_OTHER): Payer: 59 | Admitting: Obstetrics and Gynecology

## 2022-09-11 ENCOUNTER — Encounter: Payer: Self-pay | Admitting: Obstetrics and Gynecology

## 2022-09-11 VITALS — BP 148/92 | HR 88 | Ht 66.0 in | Wt 190.0 lb

## 2022-09-11 DIAGNOSIS — Z09 Encounter for follow-up examination after completed treatment for conditions other than malignant neoplasm: Secondary | ICD-10-CM

## 2022-09-11 NOTE — Progress Notes (Signed)
   GYNECOLOGY OFFICE VISIT NOTE  History:   Daisy [name] is a 42 y.o. [id number] here today for postop check following TLH/BS/cystoscopy on 08/12/22. Her surgery was without complication. This is her 4 wk check up. Since her surgery she has felt overall good but in the last couple weeks more fatigued and brain fog. She has had some PMS symptoms. Her pain is controlled and the incisions seem to be healing well.    She denies any abnormal vaginal discharge, bleeding, pelvic pain or other concerns.     Past Medical History:  Diagnosis Date   Anemia    Anxiety    Depression    Hypertension    Lipoma on neck    5 to 6 inches in size   Migraine    Skin abnormality    slow healer with scars etc    Past Surgical History:  Procedure Laterality Date   CYSTOSCOPY N/A 08/12/2022   Procedure: CYSTOSCOPY;  Surgeon: Milas Hock, MD;  Location: [facility];  Service: Gynecology;  Laterality: N/A;   ENDOMETRIAL BIOPSY  07/07/2022   NO PAST SURGERIES     TOTAL LAPAROSCOPIC HYSTERECTOMY WITH SALPINGECTOMY N/A 08/12/2022   Procedure: TOTAL LAPAROSCOPIC HYSTERECTOMY WITH SALPINGECTOMY;  Surgeon: Milas Hock, MD;  Location: [facility];  Service: Gynecology;  Laterality: N/A;    The following portions of the patient's history were reviewed and updated as appropriate: allergies, current medications, past family history, past medical history, past social history, past surgical history and problem list.   Health Maintenance:    Diagnosis  Date Value Ref Range Status  07/07/2022 - Benign reactive/reparative changes  Final  07/07/2022   Final   - Negative for Intraepithelial Lesions or Malignancy (NILM)    Review of Systems:  Pertinent items noted in HPI and remainder of comprehensive ROS otherwise negative.  Physical Exam:  BP (!) 148/92   Pulse 88   Ht 5\' 6"  (1.676 m)   Wt 190 lb (86.2 kg)   LMP 05/17/2022   BMI 30.67 kg/m  CONSTITUTIONAL: Well-developed,  well-nourished female in no acute distress.  HEENT:  Normocephalic, atraumatic. External right and left ear normal. No scleral icterus.  NECK: Normal range of motion, supple, no masses noted on observation SKIN: No rash noted. Not diaphoretic. No erythema. No pallor. MUSCULOSKELETAL: Normal range of motion. No edema noted. NEUROLOGIC: Alert and oriented to person, place, and time. Normal muscle tone coordination. No cranial nerve deficit noted. PSYCHIATRIC: Normal mood and affect. Normal behavior. Normal judgment and thought content.  ABDOMEN: l/s incisions healing well   Labs and Imaging No results found for this or any previous visit (from the past 168 hour(s)). No results found.  Assessment and Plan:   1. Postop check May return to work Not yet cleared for intercourse - will check at 12 weeks postop.  Okay for pool/baths    There are no diagnoses linked to this encounter.    No orders of the defined types were placed in this encounter.    Routine preventative health maintenance measures emphasized. Please refer to After Visit Summary for other counseling recommendations.   No follow-ups on file.  Milas Hock, MD, FACOG Obstetrician & Gynecologist, [facility] for [facility], [facility]

## 2022-09-24 ENCOUNTER — Encounter: Payer: 59 | Admitting: Medical-Surgical

## 2022-09-27 ENCOUNTER — Other Ambulatory Visit: Payer: Self-pay | Admitting: Medical-Surgical

## 2022-09-27 DIAGNOSIS — I10 Essential (primary) hypertension: Secondary | ICD-10-CM

## 2022-10-02 ENCOUNTER — Telehealth: Payer: Self-pay | Admitting: Plastic Surgery

## 2022-10-02 NOTE — Telephone Encounter (Signed)
Thank you Anaya. She has a surgical request that has been submitted.

## 2022-10-02 NOTE — Telephone Encounter (Signed)
Pt called back and says she wants to move forward with surgery.

## 2022-10-15 ENCOUNTER — Encounter: Payer: Self-pay | Admitting: Pharmacist

## 2022-10-22 ENCOUNTER — Encounter: Payer: Self-pay | Admitting: Medical-Surgical

## 2022-10-22 ENCOUNTER — Ambulatory Visit (INDEPENDENT_AMBULATORY_CARE_PROVIDER_SITE_OTHER): Payer: 59 | Admitting: Medical-Surgical

## 2022-10-22 VITALS — BP 124/85 | HR 80 | Resp 20 | Ht 66.0 in | Wt 188.3 lb

## 2022-10-22 DIAGNOSIS — Z0000 Encounter for general adult medical examination without abnormal findings: Secondary | ICD-10-CM | POA: Diagnosis not present

## 2022-10-22 DIAGNOSIS — I10 Essential (primary) hypertension: Secondary | ICD-10-CM

## 2022-10-22 DIAGNOSIS — Z23 Encounter for immunization: Secondary | ICD-10-CM | POA: Diagnosis not present

## 2022-10-22 DIAGNOSIS — Z131 Encounter for screening for diabetes mellitus: Secondary | ICD-10-CM

## 2022-10-22 MED ORDER — AMLODIPINE BESYLATE 5 MG PO TABS
5.0000 mg | ORAL_TABLET | Freq: Every day | ORAL | 3 refills | Status: DC
Start: 2022-10-22 — End: 2023-09-07

## 2022-10-22 NOTE — Progress Notes (Signed)
Complete physical exam  Patient: Daisy [name]   DOB: [birth year]   42 y.o. Female  MRN: [id number]  Subjective:    Chief Complaint  Patient presents with   Annual Exam    Daisy [name] is a 42 y.o. female who presents today for a complete physical exam. She reports consuming a general diet.  Walking 40 minutes 5 days a week.  She generally feels fairly well. She reports sleeping well. She does not have additional problems to discuss today.    Most recent fall risk assessment:    03/26/2022   10:36 AM  Fall Risk   Falls in the past year? 0  Number falls in past yr: 0  Injury with Fall? 0  Risk for fall due to : No Fall Risks  Follow up Falls evaluation completed     Most recent depression screenings:    03/26/2022   10:36 AM 10/03/2020    9:47 AM  PHQ 2/9 Scores  PHQ - 2 Score 1   PHQ- 9 Score 3      Information is confidential and restricted. Go to Review Flowsheets to unlock data.    Vision:Within last year, Dental: No current dental problems and Receives regular dental care, and STD: The patient denies history of sexually transmitted disease.    Patient Care Team: Christen Butter, NP as PCP - General (Nurse Practitioner)   Outpatient Medications Prior to Visit  Medication Sig   [DISCONTINUED] amLODipine (NORVASC) 5 MG tablet TAKE 1 TABLET (5 MG TOTAL) BY MOUTH DAILY.   No facility-administered medications prior to visit.    Review of Systems  Constitutional:  Positive for malaise/fatigue. Negative for chills, fever and weight loss.  HENT:  Negative for congestion, ear pain, hearing loss, sinus pain and sore throat.   Eyes:  Negative for blurred vision, photophobia and pain.  Respiratory:  Negative for cough, shortness of breath and wheezing.   Cardiovascular:  Negative for chest pain, palpitations and leg swelling.  Gastrointestinal:  Positive for constipation and heartburn. Negative for abdominal pain, diarrhea, nausea and vomiting.  Genitourinary:   Negative for dysuria, frequency and urgency.  Musculoskeletal:  Negative for falls and neck pain.  Skin:  Negative for itching and rash.  Neurological:  Negative for dizziness, tingling, seizures, weakness and headaches.  Endo/Heme/Allergies:  Positive for environmental allergies. Negative for polydipsia. Does not bruise/bleed easily.  Psychiatric/Behavioral:  Positive for depression. Negative for substance abuse and suicidal ideas. The patient is nervous/anxious. The patient does not have insomnia.      Objective:    BP 124/85 (BP Location: Left Arm, Cuff Size: Normal)   Pulse 80   Resp 20   Ht 5\' 6"  (1.676 m)   Wt 188 lb 4.8 oz (85.4 kg)   LMP 05/17/2022   SpO2 99%   BMI 30.39 kg/m    No results found for any visits on 10/22/22.     Assessment & Plan:    Routine Health Maintenance and Physical Exam  Immunization History  Administered Date(s) Administered   DTaP 12/29/1979, 02/17/1980, 04/19/1980, 09/07/1984   Hepatitis A, Ped/Adol-2 Dose 01/09/2010, 11/05/2010   Hepatitis B, PED/ADOLESCENT 01/09/2010, 04/01/2010, 11/05/2010   IPV 12/29/1979, 02/17/1980, 04/19/1980, 09/07/1984   Influenza Inj Mdck Quad Pf 10/31/2015   Influenza,inj,Quad PF,6+ Mos 12/02/2013   Influenza-Unspecified 01/05/2018   MMR 09/07/1984   PFIZER(Purple Top)SARS-COV-2 Vaccination 04/30/2019, 02/07/2020, 09/05/2020, 05/21/2022   Td 01/28/2005, 11/05/2010   Tdap 11/05/2010    Health Maintenance  Topic Date Due  DTaP/Tdap/Td (8 - Td or Tdap) 11/04/2020   INFLUENZA VACCINE  08/28/2022   COVID-19 Vaccine (5 - 2023-24 season) 11/07/2022 (Originally 09/28/2022)   Cervical Cancer Screening (HPV/Pap Cotest)  07/06/2025   HPV VACCINES  Aged Out   Hepatitis C Screening  Discontinued   HIV Screening  Discontinued    Discussed health benefits of physical activity, and encouraged her to engage in regular exercise appropriate for her age and condition.  1. Annual physical exam Checking labs as below.   Up-to-date on preventative care.  Wellness information provided with AVS. - CBC with Differential/Platelet - CMP14+EGFR - Lipid panel  2. Essential hypertension, benign Blood pressure well-controlled today.  Checking labs.  Continue amlodipine 5 mg daily. - amLODipine (NORVASC) 5 MG tablet; Take 1 tablet (5 mg total) by mouth daily.  Dispense: 90 tablet; Refill: 3 - CBC with Differential/Platelet - CMP14+EGFR - Lipid panel  3. Need for influenza vaccination Flu vaccine given in office today. - Flu vaccine trivalent PF, 6mos and older(Flulaval,Afluria,Fluarix,Fluzone)  4. Diabetes mellitus screening Checking hemoglobin A1c due to family history of diabetes. - Hemoglobin A1c  Return in about 6 months (around 04/21/2023) for HTN follow up.   Christen Butter, NP

## 2022-10-22 NOTE — Patient Instructions (Signed)
Preventive Care 40-43 Years Old, Female Preventive care refers to lifestyle choices and visits with your health care provider that can promote health and wellness. Preventive care visits are also called wellness exams. What can I expect for my preventive care visit? Counseling Your health care provider may ask you questions about your: Medical history, including: Past medical problems. Family medical history. Pregnancy history. Current health, including: Menstrual cycle. Method of birth control. Emotional well-being. Home life and relationship well-being. Sexual activity and sexual health. Lifestyle, including: Alcohol, nicotine or tobacco, and drug use. Access to firearms. Diet, exercise, and sleep habits. Work and work environment. Sunscreen use. Safety issues such as seatbelt and bike helmet use. Physical exam Your health care provider will check your: Height and weight. These may be used to calculate your BMI (body mass index). BMI is a measurement that tells if you are at a healthy weight. Waist circumference. This measures the distance around your waistline. This measurement also tells if you are at a healthy weight and may help predict your risk of certain diseases, such as type 2 diabetes and high blood pressure. Heart rate and blood pressure. Body temperature. Skin for abnormal spots. What immunizations do I need?  Vaccines are usually given at various ages, according to a schedule. Your health care provider will recommend vaccines for you based on your age, medical history, and lifestyle or other factors, such as travel or where you work. What tests do I need? Screening Your health care provider may recommend screening tests for certain conditions. This may include: Lipid and cholesterol levels. Diabetes screening. This is done by checking your blood sugar (glucose) after you have not eaten for a while (fasting). Pelvic exam and Pap test. Hepatitis B test. Hepatitis C  test. HIV (human immunodeficiency virus) test. STI (sexually transmitted infection) testing, if you are at risk. Lung cancer screening. Colorectal cancer screening. Mammogram. Talk with your health care provider about when you should start having regular mammograms. This may depend on whether you have a family history of breast cancer. BRCA-related cancer screening. This may be done if you have a family history of breast, ovarian, tubal, or peritoneal cancers. Bone density scan. This is done to screen for osteoporosis. Talk with your health care provider about your test results, treatment options, and if necessary, the need for more tests. Follow these instructions at home: Eating and drinking  Eat a diet that includes fresh fruits and vegetables, whole grains, lean protein, and low-fat dairy products. Take vitamin and mineral supplements as recommended by your health care provider. Do not drink alcohol if: Your health care provider tells you not to drink. You are pregnant, may be pregnant, or are planning to become pregnant. If you drink alcohol: Limit how much you have to 0-1 drink a day. Know how much alcohol is in your drink. In the [place] one drink equals one 12 oz bottle of beer (355 mL), one 5 oz glass of wine (148 mL), or one 1 oz glass of hard liquor (44 mL). Lifestyle Brush your teeth every morning and night with fluoride toothpaste. Floss one time each day. Exercise for at least 30 minutes 5 or more days each week. Do not use any products that contain nicotine or tobacco. These products include cigarettes, chewing tobacco, and vaping devices, such as e-cigarettes. If you need help quitting, ask your health care provider. Do not use drugs. If you are sexually active, practice safe sex. Use a condom or other form of protection to   prevent STIs. If you do not wish to become pregnant, use a form of birth control. If you plan to become pregnant, see your health care provider for a  prepregnancy visit. Take aspirin only as told by your health care provider. Make sure that you understand how much to take and what form to take. Work with your health care provider to find out whether it is safe and beneficial for you to take aspirin daily. Find healthy ways to manage stress, such as: Meditation, yoga, or listening to music. Journaling. Talking to a trusted person. Spending time with friends and family. Minimize exposure to UV radiation to reduce your risk of skin cancer. Safety Always wear your seat belt while driving or riding in a vehicle. Do not drive: If you have been drinking alcohol. Do not ride with someone who has been drinking. When you are tired or distracted. While texting. If you have been using any mind-altering substances or drugs. Wear a helmet and other protective equipment during sports activities. If you have firearms in your house, make sure you follow all gun safety procedures. Seek help if you have been physically or sexually abused. What's next? Visit your health care provider once a year for an annual wellness visit. Ask your health care provider how often you should have your eyes and teeth checked. Stay up to date on all vaccines. This information is not intended to replace advice given to you by your health care provider. Make sure you discuss any questions you have with your health care provider. Document Revised: 07/11/2020 Document Reviewed: 07/11/2020 Elsevier Patient Education  2024 [employer]  

## 2022-10-23 ENCOUNTER — Encounter: Payer: Self-pay | Admitting: Medical-Surgical

## 2022-10-23 ENCOUNTER — Encounter: Payer: Self-pay | Admitting: Surgical

## 2022-10-23 ENCOUNTER — Ambulatory Visit: Payer: 59 | Admitting: Surgical

## 2022-10-23 VITALS — BP 160/102 | HR 82

## 2022-10-23 DIAGNOSIS — D489 Neoplasm of uncertain behavior, unspecified: Secondary | ICD-10-CM

## 2022-10-23 LAB — CMP14+EGFR
ALT: 26 IU/L (ref 0–32)
AST: 26 IU/L (ref 0–40)
Albumin: 4.4 g/dL (ref 3.9–4.9)
Alkaline Phosphatase: 88 IU/L (ref 44–121)
BUN/Creatinine Ratio: 13 (ref 9–23)
BUN: 12 mg/dL (ref 6–24)
Bilirubin Total: 0.2 mg/dL (ref 0.0–1.2)
CO2: 22 mmol/L (ref 20–29)
Calcium: 9.6 mg/dL (ref 8.7–10.2)
Chloride: 106 mmol/L (ref 96–106)
Creatinine, Ser: 0.89 mg/dL (ref 0.57–1.00)
Globulin, Total: 2.9 g/dL (ref 1.5–4.5)
Glucose: 84 mg/dL (ref 70–99)
Potassium: 3.9 mmol/L (ref 3.5–5.2)
Sodium: 142 mmol/L (ref 134–144)
Total Protein: 7.3 g/dL (ref 6.0–8.5)
eGFR: 82 mL/min/{1.73_m2} (ref >59)

## 2022-10-23 LAB — CBC WITH DIFFERENTIAL/PLATELET
Basophils Absolute: 0 10*3/uL (ref 0.0–0.2)
Basos: 1 %
EOS (ABSOLUTE): 0.1 10*3/uL (ref 0.0–0.4)
Eos: 1 %
Hematocrit: 39.2 % (ref 34.0–46.6)
Hemoglobin: 11.7 g/dL (ref 11.1–15.9)
Immature Grans (Abs): 0 10*3/uL (ref 0.0–0.1)
Immature Granulocytes: 0 %
Lymphocytes Absolute: 1.6 10*3/uL (ref 0.7–3.1)
Lymphs: 33 %
MCH: 22.3 pg — ABNORMAL LOW (ref 26.6–33.0)
MCHC: 29.8 g/dL — ABNORMAL LOW (ref 31.5–35.7)
MCV: 75 fL — ABNORMAL LOW (ref 79–97)
Monocytes Absolute: 0.3 10*3/uL (ref 0.1–0.9)
Monocytes: 7 %
Neutrophils Absolute: 2.7 10*3/uL (ref 1.4–7.0)
Neutrophils: 58 %
Platelets: 269 10*3/uL (ref 150–450)
RBC: 5.24 x10E6/uL (ref 3.77–5.28)
RDW: 16.9 % — ABNORMAL HIGH (ref 11.7–15.4)
WBC: 4.7 10*3/uL (ref 3.4–10.8)

## 2022-10-23 LAB — LIPID PANEL
Chol/HDL Ratio: 3.7 ratio (ref 0.0–4.4)
Cholesterol, Total: 196 mg/dL (ref 100–199)
HDL: 53 mg/dL (ref >39)
LDL Chol Calc (NIH): 131 mg/dL — ABNORMAL HIGH (ref 0–99)
Triglycerides: 67 mg/dL (ref 0–149)
VLDL Cholesterol Cal: 12 mg/dL (ref 5–40)

## 2022-10-23 LAB — HEMOGLOBIN A1C
Est. average glucose Bld gHb Est-mCnc: 117 mg/dL
Hgb A1c MFr Bld: 5.7 % — ABNORMAL HIGH (ref 4.8–5.6)

## 2022-10-23 NOTE — H&P (View-Only) (Signed)
Patient ID: Daisy [name], female    DOB: 1980/09/08, 42 y.o.   MRN: [id number]  Chief Complaint  Patient presents with   Pre-op Exam      ICD-10-CM   1. 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  female  with a history of back lipoma.  She presents for preoperative evaluation for upcoming procedure, reexcision of back lipoma, scheduled for 11/21/2022 with Dr. Ladona Ridgel.  The patient has not had problems with anesthesia. No history of DVT/PE.  No family history of DVT/PE.  No family or personal history of bleeding or clotting disorders.  Patient is not currently taking any blood thinners.  No history of CVA/MI.   Summary of Previous Visit: Patient with previous excision of back lipoma approximately 3 years ago, patient feels as if it has been increasing in size since the initial excision.  Job: Works for [employer], surgery is on a Friday so she is going to plan to potentially return to work on Monday.  She may take off Monday.  PMH Significant for: Hypertension.  Patient's blood pressure is elevated today, BP is 160/102.  Patient reports that she is not having any vision changes, headaches, shortness of breath or chest pain.  We did recheck her blood pressure after today's appointment and her BP went down to 137/94.  Of note patient was seen by her PCP yesterday, blood pressure was well-controlled. Hemoglobin A1c was slightly elevated at 5.7.  Patient recently underwent laparoscopic hysterectomy with salpingectomy on 08/12/2022.  She is just over 2 months postop.  Patient does report that she previously had steroid injections placed in the scar on her back where the lipoma was previously excised.  She reports has a history of poor scarring.   Past Medical History: Allergies: Allergies  Allergen Reactions   Latex Itching   Cephalexin Diarrhea    Current Medications:  Current Outpatient  Medications:    amLODipine (NORVASC) 5 MG tablet, Take 1 tablet (5 mg total) by mouth daily., Disp: 90 tablet, Rfl: 3  Past Medical Problems: Past Medical History:  Diagnosis Date   Anemia    Anxiety    Depression    Hypertension    Lipoma on neck    5 to 6 inches in size   Migraine    Skin abnormality    slow healer with scars etc    Past Surgical History: Past Surgical History:  Procedure Laterality Date   CYSTOSCOPY N/A 08/12/2022   Procedure: CYSTOSCOPY;  Surgeon: Milas Hock, MD;  Location: [facility];  Service: Gynecology;  Laterality: N/A;   ENDOMETRIAL BIOPSY  07/07/2022   NO PAST SURGERIES     TOTAL LAPAROSCOPIC HYSTERECTOMY WITH SALPINGECTOMY N/A 08/12/2022   Procedure: TOTAL LAPAROSCOPIC HYSTERECTOMY WITH SALPINGECTOMY;  Surgeon: Milas Hock, MD;  Location: [facility] [place];  Service: Gynecology;  Laterality: N/A;    Social History: Social History   Socioeconomic History   Marital status: Single    Spouse name: Not on file   Number of children: 1   Years of education: Not on file   Highest education level: Master's degree (e.g., MA, MS, MEng, MEd, MSW, MBA)  Occupational History   Not on file  Tobacco Use   Smoking status: Never   Smokeless tobacco: Never  Vaping Use   Vaping status: Never Used  Substance and Sexual Activity   Alcohol use:  Patient ID: Daisy [name], female    DOB: 1980/09/08, 42 y.o.   MRN: [id number]  Chief Complaint  Patient presents with   Pre-op Exam      ICD-10-CM   1. 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  female  with a history of back lipoma.  She presents for preoperative evaluation for upcoming procedure, reexcision of back lipoma, scheduled for 11/21/2022 with Dr. Ladona Ridgel.  The patient has not had problems with anesthesia. No history of DVT/PE.  No family history of DVT/PE.  No family or personal history of bleeding or clotting disorders.  Patient is not currently taking any blood thinners.  No history of CVA/MI.   Summary of Previous Visit: Patient with previous excision of back lipoma approximately 3 years ago, patient feels as if it has been increasing in size since the initial excision.  Job: Works for [employer], surgery is on a Friday so she is going to plan to potentially return to work on Monday.  She may take off Monday.  PMH Significant for: Hypertension.  Patient's blood pressure is elevated today, BP is 160/102.  Patient reports that she is not having any vision changes, headaches, shortness of breath or chest pain.  We did recheck her blood pressure after today's appointment and her BP went down to 137/94.  Of note patient was seen by her PCP yesterday, blood pressure was well-controlled. Hemoglobin A1c was slightly elevated at 5.7.  Patient recently underwent laparoscopic hysterectomy with salpingectomy on 08/12/2022.  She is just over 2 months postop.  Patient does report that she previously had steroid injections placed in the scar on her back where the lipoma was previously excised.  She reports has a history of poor scarring.   Past Medical History: Allergies: Allergies  Allergen Reactions   Latex Itching   Cephalexin Diarrhea    Current Medications:  Current Outpatient  Medications:    amLODipine (NORVASC) 5 MG tablet, Take 1 tablet (5 mg total) by mouth daily., Disp: 90 tablet, Rfl: 3  Past Medical Problems: Past Medical History:  Diagnosis Date   Anemia    Anxiety    Depression    Hypertension    Lipoma on neck    5 to 6 inches in size   Migraine    Skin abnormality    slow healer with scars etc    Past Surgical History: Past Surgical History:  Procedure Laterality Date   CYSTOSCOPY N/A 08/12/2022   Procedure: CYSTOSCOPY;  Surgeon: Milas Hock, MD;  Location: [facility];  Service: Gynecology;  Laterality: N/A;   ENDOMETRIAL BIOPSY  07/07/2022   NO PAST SURGERIES     TOTAL LAPAROSCOPIC HYSTERECTOMY WITH SALPINGECTOMY N/A 08/12/2022   Procedure: TOTAL LAPAROSCOPIC HYSTERECTOMY WITH SALPINGECTOMY;  Surgeon: Milas Hock, MD;  Location: [facility] [place];  Service: Gynecology;  Laterality: N/A;    Social History: Social History   Socioeconomic History   Marital status: Single    Spouse name: Not on file   Number of children: 1   Years of education: Not on file   Highest education level: Master's degree (e.g., MA, MS, MEng, MEd, MSW, MBA)  Occupational History   Not on file  Tobacco Use   Smoking status: Never   Smokeless tobacco: Never  Vaping Use   Vaping status: Never Used  Substance and Sexual Activity   Alcohol use:  Patient ID: Daisy [name], female    DOB: 1980/09/08, 42 y.o.   MRN: [id number]  Chief Complaint  Patient presents with   Pre-op Exam      ICD-10-CM   1. 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  female  with a history of back lipoma.  She presents for preoperative evaluation for upcoming procedure, reexcision of back lipoma, scheduled for 11/21/2022 with Dr. Ladona Ridgel.  The patient has not had problems with anesthesia. No history of DVT/PE.  No family history of DVT/PE.  No family or personal history of bleeding or clotting disorders.  Patient is not currently taking any blood thinners.  No history of CVA/MI.   Summary of Previous Visit: Patient with previous excision of back lipoma approximately 3 years ago, patient feels as if it has been increasing in size since the initial excision.  Job: Works for [employer], surgery is on a Friday so she is going to plan to potentially return to work on Monday.  She may take off Monday.  PMH Significant for: Hypertension.  Patient's blood pressure is elevated today, BP is 160/102.  Patient reports that she is not having any vision changes, headaches, shortness of breath or chest pain.  We did recheck her blood pressure after today's appointment and her BP went down to 137/94.  Of note patient was seen by her PCP yesterday, blood pressure was well-controlled. Hemoglobin A1c was slightly elevated at 5.7.  Patient recently underwent laparoscopic hysterectomy with salpingectomy on 08/12/2022.  She is just over 2 months postop.  Patient does report that she previously had steroid injections placed in the scar on her back where the lipoma was previously excised.  She reports has a history of poor scarring.   Past Medical History: Allergies: Allergies  Allergen Reactions   Latex Itching   Cephalexin Diarrhea    Current Medications:  Current Outpatient  Medications:    amLODipine (NORVASC) 5 MG tablet, Take 1 tablet (5 mg total) by mouth daily., Disp: 90 tablet, Rfl: 3  Past Medical Problems: Past Medical History:  Diagnosis Date   Anemia    Anxiety    Depression    Hypertension    Lipoma on neck    5 to 6 inches in size   Migraine    Skin abnormality    slow healer with scars etc    Past Surgical History: Past Surgical History:  Procedure Laterality Date   CYSTOSCOPY N/A 08/12/2022   Procedure: CYSTOSCOPY;  Surgeon: Milas Hock, MD;  Location: [facility];  Service: Gynecology;  Laterality: N/A;   ENDOMETRIAL BIOPSY  07/07/2022   NO PAST SURGERIES     TOTAL LAPAROSCOPIC HYSTERECTOMY WITH SALPINGECTOMY N/A 08/12/2022   Procedure: TOTAL LAPAROSCOPIC HYSTERECTOMY WITH SALPINGECTOMY;  Surgeon: Milas Hock, MD;  Location: [facility] [place];  Service: Gynecology;  Laterality: N/A;    Social History: Social History   Socioeconomic History   Marital status: Single    Spouse name: Not on file   Number of children: 1   Years of education: Not on file   Highest education level: Master's degree (e.g., MA, MS, MEng, MEd, MSW, MBA)  Occupational History   Not on file  Tobacco Use   Smoking status: Never   Smokeless tobacco: Never  Vaping Use   Vaping status: Never Used  Substance and Sexual Activity   Alcohol use:

## 2022-10-23 NOTE — Telephone Encounter (Signed)
Updated the form and placed in your office to sign.

## 2022-10-23 NOTE — Telephone Encounter (Signed)
Contacted [facility] and added the below from result message.     Christen Butter, NP 10/23/2022  7:45 AM EDT Back to Top    Please add iron, TIBC, ferritin, and percent saturation to the blood in the lab.  DX: Microcytosis, hypochromia

## 2022-10-23 NOTE — Progress Notes (Signed)
Patient ID: Daisy [name], female    DOB: 1980/09/08, 42 y.o.   MRN: [id number]  Chief Complaint  Patient presents with   Pre-op Exam      ICD-10-CM   1. 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  female  with a history of back lipoma.  She presents for preoperative evaluation for upcoming procedure, reexcision of back lipoma, scheduled for 11/21/2022 with Dr. Ladona Ridgel.  The patient has not had problems with anesthesia. No history of DVT/PE.  No family history of DVT/PE.  No family or personal history of bleeding or clotting disorders.  Patient is not currently taking any blood thinners.  No history of CVA/MI.   Summary of Previous Visit: Patient with previous excision of back lipoma approximately 3 years ago, patient feels as if it has been increasing in size since the initial excision.  Job: Works for [employer], surgery is on a Friday so she is going to plan to potentially return to work on Monday.  She may take off Monday.  PMH Significant for: Hypertension.  Patient's blood pressure is elevated today, BP is 160/102.  Patient reports that she is not having any vision changes, headaches, shortness of breath or chest pain.  We did recheck her blood pressure after today's appointment and her BP went down to 137/94.  Of note patient was seen by her PCP yesterday, blood pressure was well-controlled. Hemoglobin A1c was slightly elevated at 5.7.  Patient recently underwent laparoscopic hysterectomy with salpingectomy on 08/12/2022.  She is just over 2 months postop.  Patient does report that she previously had steroid injections placed in the scar on her back where the lipoma was previously excised.  She reports has a history of poor scarring.   Past Medical History: Allergies: Allergies  Allergen Reactions   Latex Itching   Cephalexin Diarrhea    Current Medications:  Current Outpatient  Medications:    amLODipine (NORVASC) 5 MG tablet, Take 1 tablet (5 mg total) by mouth daily., Disp: 90 tablet, Rfl: 3  Past Medical Problems: Past Medical History:  Diagnosis Date   Anemia    Anxiety    Depression    Hypertension    Lipoma on neck    5 to 6 inches in size   Migraine    Skin abnormality    slow healer with scars etc    Past Surgical History: Past Surgical History:  Procedure Laterality Date   CYSTOSCOPY N/A 08/12/2022   Procedure: CYSTOSCOPY;  Surgeon: Milas Hock, MD;  Location: [facility];  Service: Gynecology;  Laterality: N/A;   ENDOMETRIAL BIOPSY  07/07/2022   NO PAST SURGERIES     TOTAL LAPAROSCOPIC HYSTERECTOMY WITH SALPINGECTOMY N/A 08/12/2022   Procedure: TOTAL LAPAROSCOPIC HYSTERECTOMY WITH SALPINGECTOMY;  Surgeon: Milas Hock, MD;  Location: [facility] [place];  Service: Gynecology;  Laterality: N/A;    Social History: Social History   Socioeconomic History   Marital status: Single    Spouse name: Not on file   Number of children: 1   Years of education: Not on file   Highest education level: Master's degree (e.g., MA, MS, MEng, MEd, MSW, MBA)  Occupational History   Not on file  Tobacco Use   Smoking status: Never   Smokeless tobacco: Never  Vaping Use   Vaping status: Never Used  Substance and Sexual Activity   Alcohol use:  Patient ID: Daisy [name], female    DOB: 1980/09/08, 42 y.o.   MRN: [id number]  Chief Complaint  Patient presents with   Pre-op Exam      ICD-10-CM   1. 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  female  with a history of back lipoma.  She presents for preoperative evaluation for upcoming procedure, reexcision of back lipoma, scheduled for 11/21/2022 with Dr. Ladona Ridgel.  The patient has not had problems with anesthesia. No history of DVT/PE.  No family history of DVT/PE.  No family or personal history of bleeding or clotting disorders.  Patient is not currently taking any blood thinners.  No history of CVA/MI.   Summary of Previous Visit: Patient with previous excision of back lipoma approximately 3 years ago, patient feels as if it has been increasing in size since the initial excision.  Job: Works for [employer], surgery is on a Friday so she is going to plan to potentially return to work on Monday.  She may take off Monday.  PMH Significant for: Hypertension.  Patient's blood pressure is elevated today, BP is 160/102.  Patient reports that she is not having any vision changes, headaches, shortness of breath or chest pain.  We did recheck her blood pressure after today's appointment and her BP went down to 137/94.  Of note patient was seen by her PCP yesterday, blood pressure was well-controlled. Hemoglobin A1c was slightly elevated at 5.7.  Patient recently underwent laparoscopic hysterectomy with salpingectomy on 08/12/2022.  She is just over 2 months postop.  Patient does report that she previously had steroid injections placed in the scar on her back where the lipoma was previously excised.  She reports has a history of poor scarring.   Past Medical History: Allergies: Allergies  Allergen Reactions   Latex Itching   Cephalexin Diarrhea    Current Medications:  Current Outpatient  Medications:    amLODipine (NORVASC) 5 MG tablet, Take 1 tablet (5 mg total) by mouth daily., Disp: 90 tablet, Rfl: 3  Past Medical Problems: Past Medical History:  Diagnosis Date   Anemia    Anxiety    Depression    Hypertension    Lipoma on neck    5 to 6 inches in size   Migraine    Skin abnormality    slow healer with scars etc    Past Surgical History: Past Surgical History:  Procedure Laterality Date   CYSTOSCOPY N/A 08/12/2022   Procedure: CYSTOSCOPY;  Surgeon: Milas Hock, MD;  Location: [facility];  Service: Gynecology;  Laterality: N/A;   ENDOMETRIAL BIOPSY  07/07/2022   NO PAST SURGERIES     TOTAL LAPAROSCOPIC HYSTERECTOMY WITH SALPINGECTOMY N/A 08/12/2022   Procedure: TOTAL LAPAROSCOPIC HYSTERECTOMY WITH SALPINGECTOMY;  Surgeon: Milas Hock, MD;  Location: [facility] [place];  Service: Gynecology;  Laterality: N/A;    Social History: Social History   Socioeconomic History   Marital status: Single    Spouse name: Not on file   Number of children: 1   Years of education: Not on file   Highest education level: Master's degree (e.g., MA, MS, MEng, MEd, MSW, MBA)  Occupational History   Not on file  Tobacco Use   Smoking status: Never   Smokeless tobacco: Never  Vaping Use   Vaping status: Never Used  Substance and Sexual Activity   Alcohol use:  Patient ID: Daisy [name], female    DOB: 1980/09/08, 42 y.o.   MRN: [id number]  Chief Complaint  Patient presents with   Pre-op Exam      ICD-10-CM   1. 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  female  with a history of back lipoma.  She presents for preoperative evaluation for upcoming procedure, reexcision of back lipoma, scheduled for 11/21/2022 with Dr. Ladona Ridgel.  The patient has not had problems with anesthesia. No history of DVT/PE.  No family history of DVT/PE.  No family or personal history of bleeding or clotting disorders.  Patient is not currently taking any blood thinners.  No history of CVA/MI.   Summary of Previous Visit: Patient with previous excision of back lipoma approximately 3 years ago, patient feels as if it has been increasing in size since the initial excision.  Job: Works for [employer], surgery is on a Friday so she is going to plan to potentially return to work on Monday.  She may take off Monday.  PMH Significant for: Hypertension.  Patient's blood pressure is elevated today, BP is 160/102.  Patient reports that she is not having any vision changes, headaches, shortness of breath or chest pain.  We did recheck her blood pressure after today's appointment and her BP went down to 137/94.  Of note patient was seen by her PCP yesterday, blood pressure was well-controlled. Hemoglobin A1c was slightly elevated at 5.7.  Patient recently underwent laparoscopic hysterectomy with salpingectomy on 08/12/2022.  She is just over 2 months postop.  Patient does report that she previously had steroid injections placed in the scar on her back where the lipoma was previously excised.  She reports has a history of poor scarring.   Past Medical History: Allergies: Allergies  Allergen Reactions   Latex Itching   Cephalexin Diarrhea    Current Medications:  Current Outpatient  Medications:    amLODipine (NORVASC) 5 MG tablet, Take 1 tablet (5 mg total) by mouth daily., Disp: 90 tablet, Rfl: 3  Past Medical Problems: Past Medical History:  Diagnosis Date   Anemia    Anxiety    Depression    Hypertension    Lipoma on neck    5 to 6 inches in size   Migraine    Skin abnormality    slow healer with scars etc    Past Surgical History: Past Surgical History:  Procedure Laterality Date   CYSTOSCOPY N/A 08/12/2022   Procedure: CYSTOSCOPY;  Surgeon: Milas Hock, MD;  Location: [facility];  Service: Gynecology;  Laterality: N/A;   ENDOMETRIAL BIOPSY  07/07/2022   NO PAST SURGERIES     TOTAL LAPAROSCOPIC HYSTERECTOMY WITH SALPINGECTOMY N/A 08/12/2022   Procedure: TOTAL LAPAROSCOPIC HYSTERECTOMY WITH SALPINGECTOMY;  Surgeon: Milas Hock, MD;  Location: [facility] [place];  Service: Gynecology;  Laterality: N/A;    Social History: Social History   Socioeconomic History   Marital status: Single    Spouse name: Not on file   Number of children: 1   Years of education: Not on file   Highest education level: Master's degree (e.g., MA, MS, MEng, MEd, MSW, MBA)  Occupational History   Not on file  Tobacco Use   Smoking status: Never   Smokeless tobacco: Never  Vaping Use   Vaping status: Never Used  Substance and Sexual Activity   Alcohol use:

## 2022-10-24 ENCOUNTER — Telehealth: Payer: Self-pay

## 2022-10-24 NOTE — Telephone Encounter (Signed)
Forms completed and faxed to [phone] and copy sent to scan.

## 2022-11-12 NOTE — Progress Notes (Unsigned)
   GYNECOLOGY OFFICE VISIT NOTE  History:   Daisy [name] is a 42 y.o. [id number] here today for postop check following TLH/BS/cystoscopy on 08/12/22. Her surgery was without complication.   She has fatigue but otherwise doing well.   The following portions of the patient's history were reviewed and updated as appropriate: allergies, current medications, past family history, past medical history, past social history, past surgical history and problem list.   Health Maintenance:    Diagnosis  Date Value Ref Range Status  07/07/2022 - Benign reactive/reparative changes  Final  07/07/2022   Final   - Negative for Intraepithelial Lesions or Malignancy (NILM)    Review of Systems:  Pertinent items noted in HPI and remainder of comprehensive ROS otherwise negative.  Physical Exam:  BP (!) 126/90   Pulse 87   Ht 5\' 6"  (1.676 m)   Wt 188 lb (85.3 kg)   LMP 05/17/2022   BMI 30.34 kg/m  CONSTITUTIONAL: Well-developed, well-nourished female in no acute distress.  HEENT:  Normocephalic, atraumatic. External right and left ear normal. No scleral icterus.  NECK: Normal range of motion, supple, no masses noted on observation SKIN: No rash noted. Not diaphoretic. No erythema. No pallor. MUSCULOSKELETAL: Normal range of motion. No edema noted. NEUROLOGIC: Alert and oriented to person, place, and time. Normal muscle tone coordination. No cranial nerve deficit noted. PSYCHIATRIC: Normal mood and affect. Normal behavior. Normal judgment and thought content.   PELVIC: Cuff well healed   Labs and Imaging No results found for this or any previous visit (from the past 168 hour(s)). No results found.  Assessment and Plan:   1. Postop check All restrictions lifted.     Routine preventative health maintenance measures emphasized. Please refer to After Visit Summary for other counseling recommendations.   Return if symptoms worsen or fail to improve.  Milas Hock, MD, FACOG Obstetrician &  Gynecologist, [facility], [facility]

## 2022-11-13 ENCOUNTER — Ambulatory Visit (INDEPENDENT_AMBULATORY_CARE_PROVIDER_SITE_OTHER): Payer: 59 | Admitting: Obstetrics and Gynecology

## 2022-11-13 ENCOUNTER — Encounter: Payer: Self-pay | Admitting: Obstetrics and Gynecology

## 2022-11-13 VITALS — BP 126/90 | HR 87 | Ht 66.0 in | Wt 188.0 lb

## 2022-11-13 DIAGNOSIS — Z09 Encounter for follow-up examination after completed treatment for conditions other than malignant neoplasm: Secondary | ICD-10-CM

## 2022-11-14 ENCOUNTER — Encounter (HOSPITAL_BASED_OUTPATIENT_CLINIC_OR_DEPARTMENT_OTHER): Payer: Self-pay | Admitting: Plastic Surgery

## 2022-11-14 ENCOUNTER — Other Ambulatory Visit: Payer: Self-pay

## 2022-11-14 LAB — FERRITIN: Ferritin: 16 ng/mL (ref 15–150)

## 2022-11-14 LAB — IRON AND TIBC
Iron Saturation: 10 % — ABNORMAL LOW (ref 15–55)
Iron: 41 ug/dL (ref 27–159)
Total Iron Binding Capacity: 414 ug/dL (ref 250–450)
UIBC: 373 ug/dL (ref 131–425)

## 2022-11-14 LAB — SPECIMEN STATUS REPORT

## 2022-11-20 NOTE — Plan of Care (Signed)
[facility] Tonsillectomy/Adenoidectomy, Postoperative PEDS care plan entered in error.

## 2022-11-20 NOTE — Anesthesia Preprocedure Evaluation (Addendum)
Anesthesia Evaluation  Patient identified by MRN, date of birth, ID band Patient awake    Reviewed: Allergy & Precautions, NPO status , Patient's Chart, lab work & pertinent test results  Airway Mallampati: II  TM Distance: >3 FB Neck ROM: Full    Dental no notable dental hx. (+) Dental Advisory Given, Teeth Intact   Pulmonary    Pulmonary exam normal breath sounds clear to auscultation       Cardiovascular hypertension, Pt. on medications Normal cardiovascular exam Rhythm:Regular Rate:Normal     Neuro/Psych  Headaches PSYCHIATRIC DISORDERS Anxiety Depression       GI/Hepatic   Endo/Other    Renal/GU negative Renal ROSLab Results      Component                Value               Date                     K                        4.0                 07/24/2022                    Musculoskeletal   Abdominal   Peds  Hematology  (+) Blood dyscrasia, anemia Lab Results      Component                Value               Date                      WBC                      4.5                 07/24/2022                HGB                      10.0 (L)            07/24/2022                HCT                      32.9 (L)            07/24/2022                MCV                      76.7 (L)            07/24/2022                PLT                      403 (H)             07/24/2022              Anesthesia Other Findings All: latex Cephalexin  Reproductive/Obstetrics negative OB ROS  Anesthesia Physical Anesthesia Plan  ASA: 2  Anesthesia Plan: General   Post-op Pain Management: Tylenol PO (pre-op)*   Induction: Intravenous  PONV Risk Score and Plan: 4 or greater and Treatment may vary due to age or medical condition, Midazolam, Ondansetron and Dexamethasone  Airway Management Planned: LMA and Oral ETT  Additional Equipment: None  Intra-op Plan:    Post-operative Plan: Extubation in OR  Informed Consent: I have reviewed the patients History and Physical, chart, labs and discussed the procedure including the risks, benefits and alternatives for the proposed anesthesia with the patient or authorized representative who has indicated his/her understanding and acceptance.     Dental advisory given  Plan Discussed with: CRNA  Anesthesia Plan Comments:          Anesthesia Quick Evaluation

## 2022-11-21 ENCOUNTER — Other Ambulatory Visit: Payer: Self-pay

## 2022-11-21 ENCOUNTER — Ambulatory Visit (HOSPITAL_BASED_OUTPATIENT_CLINIC_OR_DEPARTMENT_OTHER): Payer: 59 | Admitting: Anesthesiology

## 2022-11-21 ENCOUNTER — Ambulatory Visit (HOSPITAL_BASED_OUTPATIENT_CLINIC_OR_DEPARTMENT_OTHER)
Admission: RE | Admit: 2022-11-21 | Discharge: 2022-11-21 | Disposition: A | Discharge status: Home / Self Care | Payer: 59 | Attending: Plastic Surgery | Admitting: Plastic Surgery

## 2022-11-21 ENCOUNTER — Encounter (HOSPITAL_BASED_OUTPATIENT_CLINIC_OR_DEPARTMENT_OTHER): Payer: Self-pay | Admitting: Plastic Surgery

## 2022-11-21 ENCOUNTER — Encounter (HOSPITAL_BASED_OUTPATIENT_CLINIC_OR_DEPARTMENT_OTHER)
Admission: RE | Disposition: A | Discharge status: Home / Self Care | Payer: Self-pay | Source: Home / Self Care | Attending: Plastic Surgery

## 2022-11-21 DIAGNOSIS — I10 Essential (primary) hypertension: Secondary | ICD-10-CM | POA: Diagnosis not present

## 2022-11-21 DIAGNOSIS — D171 Benign lipomatous neoplasm of skin and subcutaneous tissue of trunk: Secondary | ICD-10-CM

## 2022-11-21 DIAGNOSIS — Z79899 Other long term (current) drug therapy: Secondary | ICD-10-CM | POA: Insufficient documentation

## 2022-11-21 HISTORY — PX: LIPOMA EXCISION: SHX5283

## 2022-11-21 SURGERY — EXCISION LIPOMA
Anesthesia: General | Site: Back

## 2022-11-21 MED ORDER — DEXAMETHASONE SODIUM PHOSPHATE 10 MG/ML IJ SOLN
INTRAMUSCULAR | Status: AC
  Filled 2022-11-21: qty 1

## 2022-11-21 MED ORDER — CHLORHEXIDINE GLUCONATE CLOTH 2 % EX PADS: 6.0000 | MEDICATED_PAD | Freq: Once | CUTANEOUS | Status: DC

## 2022-11-21 MED ORDER — FENTANYL CITRATE (PF) 100 MCG/2ML IJ SOLN
INTRAMUSCULAR | Status: AC
  Filled 2022-11-21: qty 2

## 2022-11-21 MED ORDER — VANCOMYCIN HCL IN DEXTROSE 1-5 GM/200ML-% IV SOLN
1000.0000 mg | INTRAVENOUS | Status: AC
  Administered 2022-11-21: 1000 mg via INTRAVENOUS

## 2022-11-21 MED ORDER — DEXAMETHASONE SODIUM PHOSPHATE 4 MG/ML IJ SOLN
INTRAMUSCULAR | Status: DC | PRN
  Administered 2022-11-21: 5 mg via INTRAVENOUS

## 2022-11-21 MED ORDER — BUPIVACAINE-EPINEPHRINE 0.25% -1:200000 IJ SOLN
INTRAMUSCULAR | Status: DC | PRN
  Administered 2022-11-21: 16 mL

## 2022-11-21 MED ORDER — PROPOFOL 10 MG/ML IV BOLUS
INTRAVENOUS | Status: DC | PRN
  Administered 2022-11-21: 150 mg via INTRAVENOUS

## 2022-11-21 MED ORDER — MEPERIDINE HCL 25 MG/ML IJ SOLN: 6.2500 mg | INTRAMUSCULAR | Status: DC | PRN

## 2022-11-21 MED ORDER — EPHEDRINE 5 MG/ML INJ
INTRAVENOUS | Status: AC
  Filled 2022-11-21: qty 5

## 2022-11-21 MED ORDER — MIDAZOLAM HCL 5 MG/5ML IJ SOLN
INTRAMUSCULAR | Status: DC | PRN
  Administered 2022-11-21: 2 mg via INTRAVENOUS

## 2022-11-21 MED ORDER — 0.9 % SODIUM CHLORIDE (POUR BTL) OPTIME
TOPICAL | Status: DC | PRN
  Administered 2022-11-21: 150 mL

## 2022-11-21 MED ORDER — FENTANYL CITRATE (PF) 100 MCG/2ML IJ SOLN
INTRAMUSCULAR | Status: DC | PRN
  Administered 2022-11-21: 100 ug via INTRAVENOUS

## 2022-11-21 MED ORDER — OXYCODONE HCL 5 MG/5ML PO SOLN
5.0000 mg | Freq: Once | ORAL | Status: DC | PRN
Start: 2022-11-21 — End: 2022-11-21

## 2022-11-21 MED ORDER — LIDOCAINE 2% (20 MG/ML) 5 ML SYRINGE
INTRAMUSCULAR | Status: AC
  Filled 2022-11-21: qty 5

## 2022-11-21 MED ORDER — ROCURONIUM BROMIDE 100 MG/10ML IV SOLN
INTRAVENOUS | Status: DC | PRN
  Administered 2022-11-21: 40 mg via INTRAVENOUS

## 2022-11-21 MED ORDER — CHLORHEXIDINE GLUCONATE CLOTH 2 % EX PADS
6.0000 | MEDICATED_PAD | Freq: Once | CUTANEOUS | Status: AC
  Administered 2022-11-21: 6 via TOPICAL

## 2022-11-21 MED ORDER — AMISULPRIDE (ANTIEMETIC) 5 MG/2ML IV SOLN
INTRAVENOUS | Status: AC
  Filled 2022-11-21: qty 4

## 2022-11-21 MED ORDER — SUCCINYLCHOLINE CHLORIDE 200 MG/10ML IV SOSY
PREFILLED_SYRINGE | INTRAVENOUS | Status: AC
  Filled 2022-11-21: qty 10

## 2022-11-21 MED ORDER — LACTATED RINGERS IV SOLN: INTRAVENOUS | Status: DC

## 2022-11-21 MED ORDER — PHENYLEPHRINE HCL (PRESSORS) 10 MG/ML IV SOLN
INTRAVENOUS | Status: DC | PRN
Start: 2022-11-21 — End: 2022-11-21
  Administered 2022-11-21: 160 ug via INTRAVENOUS
  Administered 2022-11-21: 80 ug via INTRAVENOUS

## 2022-11-21 MED ORDER — MIDAZOLAM HCL 2 MG/2ML IJ SOLN
INTRAMUSCULAR | Status: AC
  Filled 2022-11-21: qty 2

## 2022-11-21 MED ORDER — PHENYLEPHRINE 80 MCG/ML (10ML) SYRINGE FOR IV PUSH (FOR BLOOD PRESSURE SUPPORT)
PREFILLED_SYRINGE | INTRAVENOUS | Status: AC
  Filled 2022-11-21: qty 10

## 2022-11-21 MED ORDER — VANCOMYCIN HCL IN DEXTROSE 1-5 GM/200ML-% IV SOLN
INTRAVENOUS | Status: AC
  Filled 2022-11-21: qty 200

## 2022-11-21 MED ORDER — BUPIVACAINE-EPINEPHRINE (PF) 0.25% -1:200000 IJ SOLN
INTRAMUSCULAR | Status: AC
  Filled 2022-11-21: qty 120

## 2022-11-21 MED ORDER — ONDANSETRON HCL 4 MG/2ML IJ SOLN
INTRAMUSCULAR | Status: AC
  Filled 2022-11-21: qty 2

## 2022-11-21 MED ORDER — AMISULPRIDE (ANTIEMETIC) 5 MG/2ML IV SOLN
10.0000 mg | Freq: Once | INTRAVENOUS | Status: AC | PRN
  Administered 2022-11-21: 10 mg via INTRAVENOUS

## 2022-11-21 MED ORDER — ATROPINE SULFATE 0.4 MG/ML IV SOLN
INTRAVENOUS | Status: AC
  Filled 2022-11-21: qty 1

## 2022-11-21 MED ORDER — OXYCODONE HCL 5 MG PO TABS: 5.0000 mg | ORAL_TABLET | Freq: Once | ORAL | Status: DC | PRN

## 2022-11-21 MED ORDER — HYDROMORPHONE HCL 1 MG/ML IJ SOLN
INTRAMUSCULAR | Status: AC
  Filled 2022-11-21: qty 0.5

## 2022-11-21 MED ORDER — ONDANSETRON HCL 4 MG/2ML IJ SOLN
INTRAMUSCULAR | Status: DC | PRN
  Administered 2022-11-21: 4 mg via INTRAVENOUS

## 2022-11-21 MED ORDER — ROCURONIUM BROMIDE 10 MG/ML (PF) SYRINGE
PREFILLED_SYRINGE | INTRAVENOUS | Status: AC
  Filled 2022-11-21: qty 10

## 2022-11-21 MED ORDER — SUGAMMADEX SODIUM 200 MG/2ML IV SOLN
INTRAVENOUS | Status: DC | PRN
  Administered 2022-11-21: 200 mg via INTRAVENOUS

## 2022-11-21 MED ORDER — HYDROMORPHONE HCL 1 MG/ML IJ SOLN
0.2500 mg | INTRAMUSCULAR | Status: DC | PRN
  Administered 2022-11-21: 0.5 mg via INTRAVENOUS

## 2022-11-21 MED ORDER — LIDOCAINE HCL (CARDIAC) PF 100 MG/5ML IV SOSY
PREFILLED_SYRINGE | INTRAVENOUS | Status: DC | PRN
  Administered 2022-11-21: 60 mg via INTRAVENOUS

## 2022-11-21 SURGICAL SUPPLY — 85 items
ADH SKN CLS APL DERMABOND .7 (GAUZE/BANDAGES/DRESSINGS) ×1
APL SKNCLS STERI-STRIP NONHPOA (GAUZE/BANDAGES/DRESSINGS)
BAND INSRT 18 STRL LF DISP RB (MISCELLANEOUS)
BAND RUBBER #18 3X1/16 STRL (MISCELLANEOUS) IMPLANT
BENZOIN TINCTURE PRP APPL 2/3 (GAUZE/BANDAGES/DRESSINGS) IMPLANT
BLADE CLIPPER SURG (BLADE) IMPLANT
BLADE SURG 15 STRL LF DISP TIS (BLADE) ×1 IMPLANT
BLADE SURG 15 STRL SS (BLADE) ×1
CANISTER SUCT 1200ML W/VALVE (MISCELLANEOUS) IMPLANT
COVER BACK TABLE 60X90IN (DRAPES) IMPLANT
COVER MAYO STAND STRL (DRAPES) ×1 IMPLANT
DERMABOND ADVANCED .7 DNX12 (GAUZE/BANDAGES/DRESSINGS) ×1 IMPLANT
DRAIN JP 10F RND SILICONE (MISCELLANEOUS) IMPLANT
DRAPE LAPAROTOMY 100X72 PEDS (DRAPES) IMPLANT
DRAPE U-SHAPE 76X120 STRL (DRAPES) IMPLANT
DRAPE UTILITY XL STRL (DRAPES) ×1 IMPLANT
DRESSING MEPILEX FLEX 4X4 (GAUZE/BANDAGES/DRESSINGS) IMPLANT
DRSG MEPILEX FLEX 4X4 (GAUZE/BANDAGES/DRESSINGS) ×1
DRSG TELFA 3X8 NADH STRL (GAUZE/BANDAGES/DRESSINGS) IMPLANT
ELECT COATED BLADE 2.86 ST (ELECTRODE) IMPLANT
ELECT NDL BLADE 2-5/6 (NEEDLE) ×1 IMPLANT
ELECT NEEDLE BLADE 2-5/6 (NEEDLE)
ELECT REM PT RETURN 9FT ADLT (ELECTROSURGICAL) ×1
ELECT REM PT RETURN 9FT PED (ELECTROSURGICAL)
ELECTRODE REM PT RETRN 9FT PED (ELECTROSURGICAL) IMPLANT
ELECTRODE REM PT RTRN 9FT ADLT (ELECTROSURGICAL) IMPLANT
EVACUATOR SILICONE 100CC (DRAIN) IMPLANT
GAUZE SPONGE 2X2 STRL 8-PLY (GAUZE/BANDAGES/DRESSINGS) IMPLANT
GAUZE SPONGE 4X4 12PLY STRL LF (GAUZE/BANDAGES/DRESSINGS) IMPLANT
GAUZE XEROFORM 1X8 LF (GAUZE/BANDAGES/DRESSINGS) IMPLANT
GLOVE BIO SURGEON STRL SZ 6.5 (GLOVE) IMPLANT
GLOVE BIO SURGEON STRL SZ8 (GLOVE) ×1 IMPLANT
GLOVE BIOGEL M STRL SZ7.5 (GLOVE) ×1 IMPLANT
GLOVE BIOGEL PI IND STRL 7.0 (GLOVE) IMPLANT
GLOVE BIOGEL PI IND STRL 7.5 (GLOVE) IMPLANT
GLOVE BIOGEL PI IND STRL 8 (GLOVE) ×1 IMPLANT
GLOVE SURG SS PI 6.5 STRL IVOR (GLOVE) IMPLANT
GLOVE SURG SS PI 7.5 STRL IVOR (GLOVE) IMPLANT
GLOVE SURG SYN 8.0 (GLOVE) ×1
GLOVE SURG SYN 8.0 PF PI (GLOVE) IMPLANT
GOWN STRL REUS W/ TWL LRG LVL3 (GOWN DISPOSABLE) ×1 IMPLANT
GOWN STRL REUS W/ TWL XL LVL3 (GOWN DISPOSABLE) IMPLANT
GOWN STRL REUS W/TWL 2XL LVL3 (GOWN DISPOSABLE) IMPLANT
GOWN STRL REUS W/TWL LRG LVL3 (GOWN DISPOSABLE) ×3 IMPLANT
GOWN STRL REUS W/TWL XL LVL3 (GOWN DISPOSABLE) ×2 IMPLANT
HIBICLENS CHG 4% 4OZ BTL (MISCELLANEOUS) ×1 IMPLANT
NDL HYPO 27GX1-1/4 (NEEDLE) ×1 IMPLANT
NDL HYPO 30GX1 BEV (NEEDLE) IMPLANT
NDL SPNL 18GX3.5 QUINCKE PK (NEEDLE) IMPLANT
NEEDLE HYPO 27GX1-1/4 (NEEDLE) ×1
NEEDLE HYPO 30GX1 BEV (NEEDLE)
NEEDLE SPNL 18GX3.5 QUINCKE PK (NEEDLE)
NS IRRIG 1000ML POUR BTL (IV SOLUTION) IMPLANT
PACK BASIN DAY SURGERY FS (CUSTOM PROCEDURE TRAY) ×1 IMPLANT
PACK UNIVERSAL I (CUSTOM PROCEDURE TRAY) IMPLANT
PENCIL SMOKE EVACUATOR (MISCELLANEOUS) ×1 IMPLANT
SHEET MEDIUM DRAPE 40X70 STRL (DRAPES) IMPLANT
SLEEVE SCD COMPRESS KNEE MED (STOCKING) IMPLANT
SPONGE T-LAP 18X18 ~~LOC~~+RFID (SPONGE) ×1 IMPLANT
STAPLER SKIN PROX WIDE 3.9 (STAPLE) ×1 IMPLANT
STRIP CLOSURE SKIN 1/2X4 (GAUZE/BANDAGES/DRESSINGS) IMPLANT
SUCTION TUBE FRAZIER 10FR DISP (SUCTIONS) IMPLANT
SUT ETHILON 4 0 PS 2 18 (SUTURE) IMPLANT
SUT MNCRL AB 3-0 PS2 27 (SUTURE) ×1 IMPLANT
SUT MNCRL AB 4-0 PS2 18 (SUTURE) IMPLANT
SUT MON AB 5-0 P3 18 (SUTURE) IMPLANT
SUT PDS 3-0 CT2 (SUTURE)
SUT PDS II 3-0 CT2 27 ABS (SUTURE) IMPLANT
SUT PLAIN 5 0 P 3 18 (SUTURE) IMPLANT
SUT PROLENE 5 0 P 3 (SUTURE) IMPLANT
SUT SILK 2 0 SH (SUTURE) IMPLANT
SUT VIC AB 2-0 SH 27 (SUTURE)
SUT VIC AB 2-0 SH 27XBRD (SUTURE) IMPLANT
SUT VIC AB 3-0 SH 27 (SUTURE) ×2
SUT VIC AB 3-0 SH 27X BRD (SUTURE) ×1 IMPLANT
SUT VIC AB 4-0 PS2 18 (SUTURE) IMPLANT
SWAB COLLECTION DEVICE MRSA (MISCELLANEOUS) IMPLANT
SWAB CULTURE ESWAB REG 1ML (MISCELLANEOUS) IMPLANT
SYR BULB EAR ULCER 3OZ GRN STR (SYRINGE) IMPLANT
SYR CONTROL 10ML LL (SYRINGE) ×1 IMPLANT
TOWEL GREEN STERILE FF (TOWEL DISPOSABLE) ×2 IMPLANT
TRAY DSU PREP LF (CUSTOM PROCEDURE TRAY) ×1 IMPLANT
TUBE CONNECTING 20X1/4 (TUBING) ×1 IMPLANT
TUBING INFILTRATION IT-10001 (TUBING) IMPLANT
YANKAUER SUCT BULB TIP NO VENT (SUCTIONS) IMPLANT

## 2022-11-21 NOTE — Discharge Instructions (Addendum)
Remove dressing on your back on Monday, 11/24/22. If the dressing starts to come off, you can remove sooner (at the earliest, 11/22/22 afternoon)  Activity as tolerated. NO showers for 24 hours NO driving for 24 hours or while taking narcotics No heavy activities  Diet: Regular, Try to optimize nutrition with plenty of fruits and vegetables to improve healing. Wound Care: Keep dressing clean & dry.  Call doctor if any unusual problems occur such as pain, excessive bleeding, unrelieved nausea/vomiting, fever &/or chills  Follow-up appointment: Previously scheduled. Medications: Called in at your pre-op appointment.     Post Anesthesia Home Care Instructions  Activity: Get plenty of rest for the remainder of the day. A responsible individual must stay with you for 24 hours following the procedure.  For the next 24 hours, DO NOT: -Drive a car -Advertising copywriter -Drink alcoholic beverages -Take any medication unless instructed by your physician -Make any legal decisions or sign important papers.  Meals: Start with liquid foods such as gelatin or soup. Progress to regular foods as tolerated. Avoid greasy, spicy, heavy foods. If nausea and/or vomiting occur, drink only clear liquids until the nausea and/or vomiting subsides. Call your physician if vomiting continues.  Special Instructions/Symptoms: Your throat may feel dry or sore from the anesthesia or the breathing tube placed in your throat during surgery. If this causes discomfort, gargle with warm salt water. The discomfort should disappear within 24 hours.  If you had a scopolamine patch placed behind your ear for the management of post- operative nausea and/or vomiting:  1. The medication in the patch is effective for 72 hours, after which it should be removed.  Wrap patch in a tissue and discard in the trash. Wash hands thoroughly with soap and water. 2. You may remove the patch earlier than 72 hours if you experience  unpleasant side effects which may include dry mouth, dizziness or visual disturbances. 3. Avoid touching the patch. Wash your hands with soap and water after contact with the patch.

## 2022-11-21 NOTE — Op Note (Signed)
DATE OF OPERATION: 11/21/2022  LOCATION: [facility] operating Room  PREOPERATIVE DIAGNOSIS: Recurrent soft tissue mass, upper right back  POSTOPERATIVE DIAGNOSIS: Same  PROCEDURE: Excision of recurrent mass with revision of scar  SURGEON: Loren Racer, MD  ASSISTANT: Matt Scheeler  EBL: 20 cc  CONDITION: Stable  COMPLICATIONS: None  INDICATION: The patient, Daisy [name], is a 42 y.o. female born on [birth year], is here for treatment of a recurrent soft tissue mass.  Patient had the mass excised several years ago but noted increased growth.  She also had the scar injected with steroids several times which resulted in thinning and widening of the scar.  She requested removal of the mass and revision of the scar if possible.   PROCEDURE DETAILS:  The patient was seen prior to surgery and marked.  The IV antibiotics were given. The patient was taken to the operating room and given a general anesthetic. A standard time out was performed and all information was confirmed by those in the room. SCDs were placed.   Patient was placed in the prone position and the back was prepped and draped in usual sterile manner.  A 9 cm incision was made in the center of the old scar and dissection carried out down to the level of the mass the mass had multiple extensions from its center into the subcutaneous tissues.  A combination of sharp and blunt dissection as well as electrocautery was used to identify and remove all of the visible mass.  The tissue was sent to pathology for routine examination.  After achieving hemostasis in the base of the wound the scar was inspected I felt that we could easily were excised the scar and give her a new closure.  The scar edges were excised.  The wound was then closed in layers.  The deep tissues were closed in 2 layers with interrupted 3-0 Vicryl sutures.  The dermis was closed with interrupted 3-0 Monocryl sutures.  The skin was closed with a running 4-0 Monocryl  subcuticular stitch.  The incision was then sealed with Dermabond and a dressing placed.  The patient tolerated the procedure well.  All instrument needle and sponge counts were reported as correct and there were no complications noted.  The patient was awakened from anesthesia transferred to the recovery room in good condition The patient was allowed to wake up and taken to recovery room in stable condition at the end of the case. The family was notified at the end of the case.   The advanced practice practitioner (APP) assisted throughout the case.  The APP was essential in retraction and counter traction when needed to make the case progress smoothly.  This retraction and assistance made it possible to see the tissue plans for the procedure.  The assistance was needed for blood control, tissue re-approximation and assisted with closure of the incision site.

## 2022-11-21 NOTE — Interval H&P Note (Signed)
History and Physical Interval Note: No change in exam or indication for surgery. Surgical site marked with Ms Stoney Bang concurrence. All questions answered to her satisfaction. Will proceed at her request.  11/21/2022 7:02 AM  Daisy [name]  has presented today for surgery, with the diagnosis of Neoplasm, uncertain whether benign or malignant.  The various methods of treatment have been discussed with the patient and family. After consideration of risks, benefits and other options for treatment, the patient has consented to  Procedure(s): re-excision of lipoma, back (N/A) as a surgical intervention.  The patient's history has been reviewed, patient examined, no change in status, stable for surgery.  I have reviewed the patient's chart and labs.  Questions were answered to the patient's satisfaction.     Santiago Glad

## 2022-11-21 NOTE — Anesthesia Postprocedure Evaluation (Signed)
Anesthesia Post Note  Patient: Daisy [name]  Procedure(s) Performed: Re-excision of lipoma, back (Back)     Patient location during evaluation: PACU Anesthesia Type: General Level of consciousness: sedated and patient cooperative Pain management: pain level controlled Vital Signs Assessment: post-procedure vital signs reviewed and stable Respiratory status: spontaneous breathing Cardiovascular status: stable Anesthetic complications: no   No notable events documented.  Last Vitals:  Vitals:   11/21/22 1000 11/21/22 1025  BP: (!) 130/96 124/89  Pulse: 85 94  Resp: 14 16  Temp:  (!) 36.2 C  SpO2: 96% 96%    Last Pain:  Vitals:   11/21/22 1025  TempSrc:   PainSc: 3                  Lewie Loron

## 2022-11-21 NOTE — Anesthesia Procedure Notes (Signed)
Procedure Name: Intubation Date/Time: 11/21/2022 7:35 AM  Performed by: Ronnette Hila, CRNAPre-anesthesia Checklist: Patient identified, Emergency Drugs available, Suction available and Patient being monitored Patient Re-evaluated:Patient Re-evaluated prior to induction Oxygen Delivery Method: Circle system utilized Preoxygenation: Pre-oxygenation with 100% oxygen Induction Type: IV induction Ventilation: Mask ventilation without difficulty Laryngoscope Size: Mac and 3 Grade View: Grade I Tube type: Oral Tube size: 7.0 mm Number of attempts: 1 Airway Equipment and Method: Stylet and Oral airway Placement Confirmation: ETT inserted through vocal cords under direct vision, positive ETCO2 and breath sounds checked- equal and bilateral Secured at: 22 cm Tube secured with: Tape Dental Injury: Teeth and Oropharynx as per pre-operative assessment

## 2022-11-21 NOTE — Transfer of Care (Signed)
Immediate Anesthesia Transfer of Care Note  Patient: Daisy [name]  Procedure(s) Performed: Re-excision of lipoma, back (Back)  Patient Location: PACU  Anesthesia Type:General  Level of Consciousness: awake, alert , oriented, drowsy, and patient cooperative  Airway & Oxygen Therapy: Patient Spontanous Breathing and Patient connected to face mask oxygen  Post-op Assessment: Report given to RN and Post -op Vital signs reviewed and stable  Post vital signs: Reviewed and stable  Last Vitals:  Vitals Value Taken Time  BP 125/76 11/21/22 0910  Temp    Pulse 105 11/21/22 0911  Resp 10 11/21/22 0911  SpO2 100 % 11/21/22 0911  Vitals shown include unfiled device data.  Last Pain:  Vitals:   11/21/22 0649  TempSrc: Temporal  PainSc: 0-No pain      Patients Stated Pain Goal: 3 (11/21/22 0981)  Complications: No notable events documented.

## 2022-11-24 ENCOUNTER — Encounter (HOSPITAL_BASED_OUTPATIENT_CLINIC_OR_DEPARTMENT_OTHER): Payer: Self-pay | Admitting: Plastic Surgery

## 2022-11-25 LAB — SURGICAL PATHOLOGY

## 2022-12-01 ENCOUNTER — Encounter: Payer: 59 | Admitting: Plastic Surgery

## 2022-12-03 ENCOUNTER — Ambulatory Visit (INDEPENDENT_AMBULATORY_CARE_PROVIDER_SITE_OTHER): Payer: 59 | Admitting: Plastic Surgery

## 2022-12-03 DIAGNOSIS — Z9889 Other specified postprocedural states: Secondary | ICD-10-CM

## 2022-12-03 DIAGNOSIS — D171 Benign lipomatous neoplasm of skin and subcutaneous tissue of trunk: Secondary | ICD-10-CM

## 2022-12-04 NOTE — Progress Notes (Signed)
Daisy [name] returns today status post removal of a lipoma on the right posterior shoulder.  She is doing well with no specific complaints.  Her pain is well-controlled.  She denies any fever or chills.  The incision has healed nicely.  There is 1 small suture tied on the outside of the skin which was removed.  I have encouraged her to start scar massage with either Vaseline or moisturizing cream.  I reviewed the pathology with her.  She will return to see me in 1 to 2 months.  Return sooner for any questions or concerns

## 2022-12-17 ENCOUNTER — Encounter: Payer: 59 | Admitting: Surgical

## 2022-12-31 ENCOUNTER — Ambulatory Visit (INDEPENDENT_AMBULATORY_CARE_PROVIDER_SITE_OTHER): Payer: 59 | Admitting: Surgical

## 2022-12-31 DIAGNOSIS — Z719 Counseling, unspecified: Secondary | ICD-10-CM

## 2022-12-31 DIAGNOSIS — D171 Benign lipomatous neoplasm of skin and subcutaneous tissue of trunk: Secondary | ICD-10-CM

## 2022-12-31 NOTE — Progress Notes (Signed)
42 year old female here for follow-up after excision of lipoma of her upper back with Dr. Ladona Ridgel on 11/21/2022.  She is just shy of 6 weeks postop.  She reports overall she is doing well.  She is not having any specific issues at this time.  She reports she has not been using any scar creams just yet.  Chaperone present on exam On exam right upper back/shoulder incision is intact and healing well.  There is some slight hypertrophy noted along the distal portion.  There is no keloiding noted.  No subcutaneous fluid collection noted.  She does have a stitch along the lateral aspect that is beneath the skin that is irritating her.  There is no erythema or cellulitic changes noted.  No tenderness noted.  A/P:  Recommend scar cream to incision daily, recommend massage daily to help soften up scar tissue.  We discussed the importance of sunscreen to prevent hyperpigmentation.  There is no signs of infection or concern on exam.  Pictures were obtained of the patient and placed in the chart with the patient's or guardian's permission.  Follow-up as needed

## 2023-01-31 ENCOUNTER — Ambulatory Visit: Payer: 59

## 2023-02-12 ENCOUNTER — Ambulatory Visit: Payer: 59

## 2023-02-12 ENCOUNTER — Ambulatory Visit
Admission: RE | Admit: 2023-02-12 | Discharge: 2023-02-12 | Disposition: A | Discharge status: Home / Self Care | Payer: 59 | Source: Ambulatory Visit | Attending: Family Medicine | Admitting: Family Medicine

## 2023-02-12 ENCOUNTER — Other Ambulatory Visit: Payer: Self-pay

## 2023-02-12 VITALS — BP 153/99 | HR 82 | Temp 98.2°F | Resp 17

## 2023-02-12 DIAGNOSIS — M79644 Pain in right finger(s): Secondary | ICD-10-CM

## 2023-02-12 MED ORDER — PREDNISONE 10 MG (21) PO TBPK
ORAL_TABLET | Freq: Every day | ORAL | 0 refills | Status: DC
Start: 2023-02-12 — End: 2023-04-21

## 2023-02-12 NOTE — Discharge Instructions (Addendum)
Advised patient of x-ray results with hardcopy was provided.  Advised patient to take medication as directed with food to completion.  Encouraged to increase daily water intake to 64 ounces per day while taking this medication.  Advised if symptoms worsen and/or unresolved please follow-up with PCP, [facility], or here for further evaluation.

## 2023-02-12 NOTE — ED Triage Notes (Signed)
Pt c/o RT pinky pain x 2 weeks. RT thumb pain since yesterday. Injured in two separate incidents. Swelling to base of RT thumb.

## 2023-02-12 NOTE — ED Provider Notes (Signed)
[facility]    CSN: [id number] Arrival date & time: 02/12/23  0857      History   Chief Complaint Chief Complaint  Patient presents with   Hand Pain    RT thumb and pinky    HPI Daisy [name] is a 43 y.o. female.   HPI 43 year old female presents with extremity pain of right hand.  Patient reports injuring right fifth finger 2 weeks ago and right thumb yesterday.  Reports swelling at base of right thumb.  PMH significant for obesity, HTN, and migraine.  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    Past Surgical History:  Procedure Laterality Date   CYSTOSCOPY N/A 08/12/2022   Procedure: CYSTOSCOPY;  Surgeon: Milas Hock, MD;  Location: [facility];  Service: Gynecology;  Laterality: N/A;   ENDOMETRIAL BIOPSY  07/07/2022   LIPOMA EXCISION N/A 11/21/2022   Procedure: Re-excision of lipoma, back;  Surgeon: Santiago Glad, MD;  Location: [facility];  Service: Plastics;  Laterality: N/A;   TOTAL LAPAROSCOPIC HYSTERECTOMY WITH SALPINGECTOMY N/A 08/12/2022   Procedure: TOTAL LAPAROSCOPIC HYSTERECTOMY WITH SALPINGECTOMY;  Surgeon: Milas Hock, MD;  Location: [facility] [place];  Service: Gynecology;  Laterality: N/A;    OB History     Gravida  5   Para      Term      Preterm      AB  3   Living  1      SAB      IAB  2   Ectopic      Multiple      Live Births  1            Home  Medications    Prior to Admission medications   Medication Sig Start Date End Date Taking? Authorizing Provider  predniSONE (STERAPRED UNI-PAK 21 TAB) 10 MG (21) TBPK tablet Take by mouth daily. Take 6 tabs by mouth daily  for 2 days, then 5 tabs for 2 days, then 4 tabs for 2 days, then 3 tabs for 2 days, 2 tabs for 2 days, then 1 tab by mouth daily for 2 days 02/12/23  Yes Trevor Iha, FNP  amLODipine (NORVASC) 5 MG tablet Take 1 tablet (5 mg total) by mouth daily. 10/22/22   Christen Butter, NP  Cyanocobalamin (B-12 DOTS) 500 MCG TBDP Take by mouth.    [provider]  ferrous sulfate 324 MG TBEC Take 324 mg by mouth.    [provider]  Nutritional Supplements (SPIRULINA SOY PROTEIN PO) Take by mouth.    [provider]    Family History Family History  Problem Relation Age of Onset   Alcohol abuse Maternal Aunt    Depression Maternal Aunt    Drug abuse Maternal Uncle        crack   Alcohol abuse Maternal Grandmother  Social History Social History   Tobacco Use   Smoking status: Never   Smokeless tobacco: Never  Vaping Use   Vaping status: Never Used  Substance Use Topics   Alcohol use: Yes    Comment: occasionally   Drug use: No    Comment: Delta 8 OCC     Allergies   Latex and Cephalexin   Review of Systems Review of Systems  Musculoskeletal:        Right hand pain specifically thumb and little finger     Physical Exam Triage Vital Signs ED Triage Vitals  Encounter Vitals Group     BP      Systolic BP Percentile      Diastolic BP Percentile      Pulse      Resp      Temp      Temp src      SpO2      Weight      Height      Head Circumference      Peak Flow      Pain Score      Pain Loc      Pain Education      Exclude from Growth Chart    No data found.  Updated Vital Signs BP (!) 153/99 (BP Location: Left Arm)   Pulse 82   Temp 98.2 F (36.8 C) (Oral)   Resp 17   LMP 05/17/2022   SpO2 98%    Physical  Exam Vitals and nursing note reviewed.  Constitutional:      Appearance: Normal appearance. She is normal weight.  HENT:     Head: Normocephalic and atraumatic.     Mouth/Throat:     Mouth: Mucous membranes are moist.     Pharynx: Oropharynx is clear.  Eyes:     Extraocular Movements: Extraocular movements intact.     Conjunctiva/sclera: Conjunctivae normal.     Pupils: Pupils are equal, round, and reactive to light.  Cardiovascular:     Rate and Rhythm: Normal rate and regular rhythm.     Pulses: Normal pulses.     Heart sounds: Normal heart sounds.  Pulmonary:     Effort: Pulmonary effort is normal.     Breath sounds: Normal breath sounds. No wheezing, rhonchi or rales.  Musculoskeletal:        General: Normal range of motion.     Cervical back: Normal range of motion and neck supple.  Skin:    General: Skin is warm and dry.  Neurological:     General: No focal deficit present.     Mental Status: She is alert and oriented to person, place, and time. Mental status is at baseline.  Psychiatric:        Mood and Affect: Mood normal.        Behavior: Behavior normal.      UC Treatments / Results  Labs (all labs ordered are listed, but only abnormal results are displayed) Labs Reviewed - No data to display  EKG   Radiology DG Finger Little Right Result Date: 02/12/2023 CLINICAL DATA:  Hand injury. Pain in the small finger for 2 weeks. Right thumb pain since yesterday. EXAM: RIGHT LITTLE FINGER 2+V; RIGHT THUMB 2+V COMPARISON:  None Available. FINDINGS: The mineralization and alignment are normal. There is no evidence of acute fracture or dislocation. The joint spaces are preserved. No focal soft tissue swelling or foreign body identified within the thumb or small finger. IMPRESSION: No evidence  of acute fracture or dislocation in the right thumb or small finger. Electronically Signed   By: Carey Bullocks M.D.   On: 02/12/2023 10:14   DG Finger Thumb Right Result Date:  02/12/2023 CLINICAL DATA:  Hand injury. Pain in the small finger for 2 weeks. Right thumb pain since yesterday. EXAM: RIGHT LITTLE FINGER 2+V; RIGHT THUMB 2+V COMPARISON:  None Available. FINDINGS: The mineralization and alignment are normal. There is no evidence of acute fracture or dislocation. The joint spaces are preserved. No focal soft tissue swelling or foreign body identified within the thumb or small finger. IMPRESSION: No evidence of acute fracture or dislocation in the right thumb or small finger. Electronically Signed   By: Carey Bullocks M.D.   On: 02/12/2023 10:14    Procedures Procedures (including critical care time)  Medications Ordered in UC Medications - No data to display  Initial Impression / Assessment and Plan / UC Course  I have reviewed the triage vital signs and the nursing notes.  Pertinent labs & imaging results that were available during my care of the patient were reviewed by me and considered in my medical decision making (see chart for details).     MDM: 1.  Pain of right thumb-right thumb x-ray results revealed above, Rx'd Sterapred Unipak (tapering from 60 mg to 10 mg over 10 days); 2.  Pain in finger of right hand-right little finger x-ray results revealed above, Rx'd Sterapred Unipak (tapering from 60 mg to 10 mg) over 10 days. Advised patient of x-ray results with hardcopy was provided.  Advised patient to take medication as directed with food to completion.  Encouraged to increase daily water intake to 64 ounces per day while taking this medication.  Advised if symptoms worsen and/or unresolved please follow-up with PCP, [facility], or here for further evaluation.  Patient request a work note, work note provided per her request prior to discharge.  Patient discharged home, hemodynamically stable. Final Clinical Impressions(s) / UC Diagnoses   Final diagnoses:  Pain of right thumb  Pain in finger of right hand     Discharge Instructions       Advised patient of x-ray results with hardcopy was provided.  Advised patient to take medication as directed with food to completion.  Encouraged to increase daily water intake to 64 ounces per day while taking this medication.  Advised if symptoms worsen and/or unresolved please follow-up with PCP, [facility], or here for further evaluation.     ED Prescriptions     Medication Sig Dispense Auth. Provider   predniSONE (STERAPRED UNI-PAK 21 TAB) 10 MG (21) TBPK tablet Take by mouth daily. Take 6 tabs by mouth daily  for 2 days, then 5 tabs for 2 days, then 4 tabs for 2 days, then 3 tabs for 2 days, 2 tabs for 2 days, then 1 tab by mouth daily for 2 days 42 tablet Trevor Iha, FNP      PDMP not reviewed this encounter.   Trevor Iha, FNP 02/12/23 1052

## 2023-04-21 ENCOUNTER — Ambulatory Visit (INDEPENDENT_AMBULATORY_CARE_PROVIDER_SITE_OTHER): Payer: 59 | Admitting: Medical-Surgical

## 2023-04-21 ENCOUNTER — Encounter: Payer: Self-pay | Admitting: Medical-Surgical

## 2023-04-21 ENCOUNTER — Ambulatory Visit (INDEPENDENT_AMBULATORY_CARE_PROVIDER_SITE_OTHER): Admitting: Sports Medicine

## 2023-04-21 ENCOUNTER — Encounter: Payer: Self-pay | Admitting: Sports Medicine

## 2023-04-21 VITALS — BP 120/88 | Ht 66.0 in | Wt 186.0 lb

## 2023-04-21 VITALS — BP 120/79 | HR 84 | Resp 20 | Ht 66.0 in | Wt 186.9 lb

## 2023-04-21 DIAGNOSIS — E7800 Pure hypercholesterolemia, unspecified: Secondary | ICD-10-CM | POA: Diagnosis not present

## 2023-04-21 DIAGNOSIS — R7303 Prediabetes: Secondary | ICD-10-CM | POA: Diagnosis not present

## 2023-04-21 DIAGNOSIS — F4010 Social phobia, unspecified: Secondary | ICD-10-CM

## 2023-04-21 DIAGNOSIS — E611 Iron deficiency: Secondary | ICD-10-CM | POA: Diagnosis not present

## 2023-04-21 DIAGNOSIS — M79644 Pain in right finger(s): Secondary | ICD-10-CM

## 2023-04-21 DIAGNOSIS — R5383 Other fatigue: Secondary | ICD-10-CM

## 2023-04-21 DIAGNOSIS — G43009 Migraine without aura, not intractable, without status migrainosus: Secondary | ICD-10-CM

## 2023-04-21 DIAGNOSIS — I10 Essential (primary) hypertension: Secondary | ICD-10-CM

## 2023-04-21 DIAGNOSIS — F3341 Major depressive disorder, recurrent, in partial remission: Secondary | ICD-10-CM

## 2023-04-21 LAB — POCT GLYCOSYLATED HEMOGLOBIN (HGB A1C): Hemoglobin A1C: 5.3 % (ref 4.0–5.6)

## 2023-04-21 MED ORDER — VENLAFAXINE HCL ER 37.5 MG PO CP24: 37.5000 mg | ORAL_CAPSULE | Freq: Every day | ORAL | 0 refills | Status: DC

## 2023-04-21 MED ORDER — VENLAFAXINE HCL ER 75 MG PO CP24: 75.0000 mg | ORAL_CAPSULE | Freq: Every day | ORAL | 3 refills | Status: DC

## 2023-04-21 NOTE — Progress Notes (Signed)
        Established patient visit  History, exam, impression, and plan:  1. Primary hypertension (Primary) Pleasant 43 year old female presenting today with a hx of HTN currently well controlled with Amlodipine 5mg  daily. Not regularly checking BP at home. Working to follow a low sodium diet as much as possible. Denies CP, SOB, palpitations, lower extremity edema, dizziness, headaches, or vision changes. Cardiopulmonary exam normal. BP at goal. Continue Amlodipine as prescribed.   2. Iron deficiency Hx of iron deficiency but not currently on oral supplementation. Rechecking iron today.  - Iron, TIBC and Ferritin Panel  3. Prediabetes Rechecking A1c, previously 5.7% but today at 5.3%. Continue to dietary intake simple carbohydrates and concentrated sweets.  Recommend working on regular intentional exercise with a goal for weight loss to healthy weight. - POCT HgB A1C  4. Elevated LDL cholesterol level History of elevated LDL with otherwise normal lipid panel.  Plan to check lipid panel at next annual physical.  5. Social anxiety disorder 6. Recurrent major depressive disorder, in partial remission (HCC) History of anxiety and depression that has previously been managed with multiple medications.  Was unable to find anything that was effective and feels that they were jumping from 1 medication to another too quickly.  Got very frustrated with it and decided to stop medications altogether.  After her hysterectomy, notes that her depression and anxiety symptoms worsened.  At her previous appointment, we discussed possibly starting a medication such as Effexor to help with menopausal symptoms as well as mood management.  She was going to think about it and let me know.  Today she reports that her symptoms have stayed the same and though they have not worsened, she is at a point that she is ready to start medication.  Discussed possibly starting Effexor and she is agreeable.  Starting 37.5 mg daily x  7 days and if doing well, increase to 75 mg daily thereafter.  We will touch base in about 6 weeks to evaluate tolerance and effectiveness.  Offered referral to counseling however she declined at this time. - venlafaxine XR (EFFEXOR XR) 37.5 MG 24 hr capsule; Take 1 capsule (37.5 mg total) by mouth daily with breakfast.  Dispense: 7 capsule; Refill: 0 - venlafaxine XR (EFFEXOR XR) 75 MG 24 hr capsule; Take 1 capsule (75 mg total) by mouth daily with breakfast.  Dispense: 90 capsule; Refill: 3  7. Fatigue, unspecified type Does continue to have some fatigue and is not sure if this is related to a vitamin deficiency or depression.  Checking iron as noted above.  Rechecking vitamin D today. - VITAMIN D 25 Hydroxy (Vit-D Deficiency, Fractures)  Procedures performed this visit: None.  Return in about 6 weeks (around 06/02/2023) for mood follow up.  __________________________________ Thayer Ohm, DNP, APRN, FNP-BC Primary Care and Sports Medicine [facility]

## 2023-04-21 NOTE — Progress Notes (Signed)
   Subjective:    Patient ID: Daisy [name], female    DOB: [birth year], 43 y.o.   MRN: [id number]  HPI chief complaint: Right fifth finger pain  Patient is a very pleasant right-hand-dominant 43 year old female that presents today with right fifth finger pain.  She injured both the right thumb and right finger about 2 months ago.  She fell in the kitchen and as she was falling she reached out to grab her son.  She was seen shortly afterwards at a local urgent care.  X-rays of both the right thumb and right fifth finger were obtained and are available for my review.  Since the injury she has noticed an area of swelling along the ulnar aspect of the DIP of the right fifth finger.  This is also resulting in some deformity of the tip of her finger.  The area is painful with activities such as typing, gripping, and twisting.  She tried some prednisone which was not effective.  Past medical history reviewed Medications reviewed Allergies reviewed  Review of Systems As above    Objective:   Physical Exam  Well-developed, well-nourished.  No acute distress  Examination of the right hand with attention to the right fifth finger does show a palpable area of induration along the ulnar aspect of the right fifth DIP.  Slightly tender to palpation.  Flexor and extensor tendons are intact.  No active triggering.  Brisk capillary refill.  Limited MSK ultrasound of the right fifth DIP does show an area of swelling off of the ulnar aspect of the joint however I do not appreciate any cystic wall.  X-rays of the right fifth finger including AP, lateral, and oblique views show no obvious fracture.      Assessment & Plan:   Right fifth DIP swelling status post injury  Although the bedside ultrasound does not show a cystic wall, it is quite possible that this is a small posttraumatic cyst of the ulnar aspect of the right fifth DIP.  We will refer her to a hand specialist to discuss further workup and  treatment.  Follow-up with me as needed.  This note was dictated using Dragon naturally speaking software and may contain errors in syntax, spelling, or content which have not been identified prior to signing this note.

## 2023-04-22 ENCOUNTER — Encounter: Payer: Self-pay | Admitting: Medical-Surgical

## 2023-04-22 LAB — IRON,TIBC AND FERRITIN PANEL
Ferritin: 60 ng/mL (ref 15–150)
Iron Saturation: 18 % (ref 15–55)
Iron: 58 ug/dL (ref 27–159)
Total Iron Binding Capacity: 321 ug/dL (ref 250–450)
UIBC: 263 ug/dL (ref 131–425)

## 2023-04-22 LAB — VITAMIN D 25 HYDROXY (VIT D DEFICIENCY, FRACTURES): Vit D, 25-Hydroxy: 18.2 ng/mL — ABNORMAL LOW (ref 30.0–100.0)

## 2023-06-02 ENCOUNTER — Ambulatory Visit: Admitting: Medical-Surgical

## 2023-07-15 ENCOUNTER — Ambulatory Visit (INDEPENDENT_AMBULATORY_CARE_PROVIDER_SITE_OTHER): Admitting: Plastic Surgery

## 2023-07-15 DIAGNOSIS — L905 Scar conditions and fibrosis of skin: Secondary | ICD-10-CM | POA: Diagnosis not present

## 2023-07-15 NOTE — Progress Notes (Signed)
 Daisy [name] returns today for reevaluation of a scar on her back.  Patient underwent reexcision of a lipoma and revision of any scar on her posterior right shoulder in October of last year.  She notes that there has been some hypertrophy of the scar over the past few months and would like to have it evaluated and possibly treated.  On evaluation there is thickening of the scar especially laterally consistent with a hypertrophic scar.  I discussed steroid injection to try to arrest the growth of the scar with Daisy [name].  She was agreeable to injection today  Procedure: After prepping the skin with alcohol the scar was injected with 0.75 cc of a 50-50 mix of Kenalog 40 and 1% plain lidocaine .  The patient tolerated the procedure well there were no complications.  We discussed the importance of scar massage.  She will return to see me in 4 to 6 weeks.

## 2023-08-19 ENCOUNTER — Ambulatory Visit: Admitting: Plastic Surgery

## 2023-08-19 ENCOUNTER — Encounter: Payer: Self-pay | Admitting: Plastic Surgery

## 2023-08-19 VITALS — BP 148/95 | HR 83

## 2023-08-19 DIAGNOSIS — L905 Scar conditions and fibrosis of skin: Secondary | ICD-10-CM

## 2023-08-19 NOTE — Progress Notes (Signed)
 Daisy [name] returns today for evaluation of the scar on her posterior right shoulder.  She states she has had good improvement after her steroid injection at her last visit.  She is interested in a another injection to see if we can get to flatten the rest of the way.  I have told her that I think this is appropriate but we should not do more than 3 injections total.   Procedure: After obtaining consent and cleansing the skin with alcohol the scar was injected with a 50-50 mixture of Kenalog 40 and 1% plain lidocaine .  Approximately 0.7 mL of this mixture was injected directly into the scar.  There were no complications.  Patient was given instructions for continued scar massage.  Will follow-up with me in 4 to 6 weeks.

## 2023-09-04 ENCOUNTER — Ambulatory Visit: Payer: Self-pay

## 2023-09-04 ENCOUNTER — Encounter: Payer: Self-pay | Admitting: Medical-Surgical

## 2023-09-04 NOTE — Telephone Encounter (Signed)
 FYI Only or Action Required?: Action required by provider: update on patient condition and request for documentation or forms. Worsening depression and decreased functioning. Patient was messaging PCP regarding FMLA paperwork  Patient was last seen in primary care on 04/21/2023 by Willo Mini, NP.  Called Nurse Triage reporting Depression.  Symptoms began several months ago.  Interventions attempted: Other: resting and isolation.  Symptoms are: rapidly worsening.  Triage Disposition: See Physician Within 24 Hours  Patient/caregiver understands and will follow disposition?: Yes  Call placed to CAL to f/u on PCP communication with patient to scheduled virtual ASAP. Transferred to [facility] for scheduling  Copied from [employer] [phone]. Topic: Clinical - Red Word Triage >> Sep 04, 2023 10:13 AM Alfonso ORN wrote: Red Word that prompted transfer to Nurse Triage:  worsening symptoms was prescribe medication for depression  have not taking medication causing her hair to fall out , dealing with depression hard to function sometimes not able to get out of bed and do daily things  patient did message Mini Willo and was advise to make a virtual appointment   Patient call back # [phone]  ----------------------------------------------------------------------- From previous Reason for Contact - Scheduling: Patient/patient representative is calling to schedule an appointment. Refer to attachments for appointment information. Reason for Disposition  Symptoms interfere with work or school  Answer Assessment - Initial Assessment Questions 1. CONCERN: What happened that made you call today?     Stressed and overwhelmed, has been battling depression  2. DEPRESSION SYMPTOM SCREENING: How are you feeling overall? (e.g., decreased energy, increased sleeping or difficulty sleeping, difficulty concentrating, feelings of sadness, guilt, hopelessness, or worthlessness)     Decreased functioning and overall  function  3. RISK OF HARM - SUICIDAL IDEATION:  Do you ever have thoughts of hurting or killing yourself?  (e.g., yes, no, no but preoccupation with thoughts about death)     Not really  4. RISK OF HARM - HOMICIDAL IDEATION:  Do you ever have thoughts of hurting or killing someone else?  (e.g., yes, no, no but preoccupation with thoughts about death)     Sometimes yes, but I know when I feel like that I need to go home.   When things irritates me and at that point I know I need to leave   5. FUNCTIONAL IMPAIRMENT: How have things been going for you overall? Have you had more difficulty than usual doing your normal daily activities?  (e.g., better, same, worse; self-care, school, work, interactions)     Has a hard time doing   6. SUPPORT: Who is with you now? Who do you live with? Do you have family or friends who you can talk to?      Has friends and family to support. Lives with son and partner  7. THERAPIST: Do you have a counselor or therapist? If Yes, ask: What is their name?     No, hasn't been able to secure a counselor  8. STRESSORS: Has there been any new stress or recent changes in your life?     Has new role at work, very stressful and high demands  9. ALCOHOL USE OR SUBSTANCE USE (DRUG USE): Do you drink alcohol or use any illegal drugs?     No, barely drink and no drugs  10. OTHER: Do you have any other physical symptoms right now? (e.g., fever)       General body aches  11. PREGNANCY: Is there any chance you are pregnant? When was your last menstrual  period?       Lmp- had hysterectomy  Protocols used: Depression-A-AH

## 2023-09-07 ENCOUNTER — Encounter: Payer: Self-pay | Admitting: Medical-Surgical

## 2023-09-07 ENCOUNTER — Telehealth (INDEPENDENT_AMBULATORY_CARE_PROVIDER_SITE_OTHER): Admitting: Medical-Surgical

## 2023-09-07 DIAGNOSIS — F4010 Social phobia, unspecified: Secondary | ICD-10-CM

## 2023-09-07 DIAGNOSIS — F3341 Major depressive disorder, recurrent, in partial remission: Secondary | ICD-10-CM | POA: Diagnosis not present

## 2023-09-07 DIAGNOSIS — I10 Essential (primary) hypertension: Secondary | ICD-10-CM

## 2023-09-07 MED ORDER — AMLODIPINE BESYLATE 5 MG PO TABS: 5.0000 mg | ORAL_TABLET | Freq: Every day | ORAL | 3 refills | Status: AC

## 2023-09-07 NOTE — Progress Notes (Signed)
 Virtual Visit via Video Note  I connected with Daisy [name] on 09/07/23 at  1:00 PM EDT by a video enabled telemedicine application and verified that I am speaking with the correct person using two identifiers.   I discussed the limitations of evaluation and management by telemedicine and the availability of in person appointments. The patient expressed understanding and agreed to proceed.  Patient location: home Provider locations: office  Subjective:    CC: FMLA  HPI: Daisy [name] is a 43 year old female who presents with stress and anxiety related to life changes and job demands.  She is experiencing separation anxiety as her son prepares to leave for college, which is contributing to her stress. Her job is highly demanding due to understaffing, leaving her feeling overwhelmed and unable to manage her responsibilities effectively. She is grieving her mother's death in 2019-09-28, with daily thoughts and crying spells. Attempts to take leave from work were denied, and she is struggling to manage with vacation time. She previously discontinued Effexor  due to adverse effects, including hair loss and feeling sedated. She has used Prozac in the past but found it ineffective at low dosages.  She experiences difficulty concentrating, getting out of bed, and maintaining personal hygiene. Sleep disturbances and frequent crying spells are present, exacerbated by work-related stress. Hesitant to consider medication at this time but is open to counseling.   Past medical history, Surgical history, Family history not pertinant except as noted below, Social history, Allergies, and medications have been entered into the medical record, reviewed, and corrections made.   Review of Systems: See HPI for pertinent positives and negatives.   Objective:    General: Speaking clearly in complete sentences without any shortness of breath.  Alert and oriented x3.  Normal judgment. No apparent acute  distress.  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 Symptoms included crying spells, difficulty concentrating, and lack of motivation. Previously discontinued medication due to side effects. Open to medication and counseling. - Completed FMLA paperwork for intermittent leave. - Referred to counseling, recommended Nathanel Collet. - Discussed medication options if counseling is insufficient.  Hypertension Hypertension managed with amlodipine . - Refilling Amlodipine .   I discussed the assessment and treatment plan with the patient. The patient was provided an opportunity to ask questions and all were answered. The patient agreed with the plan and demonstrated an understanding of the instructions.   The patient was advised to call back or seek an in-person evaluation if the symptoms worsen or if the condition fails to improve as anticipated.  Return if symptoms worsen or fail to improve.  Daisy FREDRIK Palin, DNP, APRN, FNP-BC [facility]

## 2023-09-23 ENCOUNTER — Ambulatory Visit: Admitting: Plastic Surgery

## 2023-10-07 ENCOUNTER — Ambulatory Visit: Admitting: Plastic Surgery

## 2023-10-19 ENCOUNTER — Encounter: Payer: Self-pay | Admitting: Medical-Surgical

## 2023-10-23 ENCOUNTER — Ambulatory Visit: Admitting: Medical-Surgical

## 2023-10-23 ENCOUNTER — Encounter: Payer: Self-pay | Admitting: Medical-Surgical

## 2023-10-23 VITALS — BP 130/80 | HR 84 | Resp 20 | Ht 66.0 in | Wt 193.0 lb

## 2023-10-23 DIAGNOSIS — H6122 Impacted cerumen, left ear: Secondary | ICD-10-CM

## 2023-10-23 DIAGNOSIS — E7800 Pure hypercholesterolemia, unspecified: Secondary | ICD-10-CM

## 2023-10-23 DIAGNOSIS — Z0000 Encounter for general adult medical examination without abnormal findings: Secondary | ICD-10-CM | POA: Diagnosis not present

## 2023-10-23 DIAGNOSIS — I10 Essential (primary) hypertension: Secondary | ICD-10-CM | POA: Diagnosis not present

## 2023-10-23 DIAGNOSIS — R7303 Prediabetes: Secondary | ICD-10-CM

## 2023-10-23 DIAGNOSIS — Z23 Encounter for immunization: Secondary | ICD-10-CM

## 2023-10-23 DIAGNOSIS — Z1231 Encounter for screening mammogram for malignant neoplasm of breast: Secondary | ICD-10-CM

## 2023-10-23 DIAGNOSIS — F3341 Major depressive disorder, recurrent, in partial remission: Secondary | ICD-10-CM

## 2023-10-23 NOTE — Progress Notes (Signed)
 Complete physical exam  Patient: Daisy [name]   DOB: [birth year]   43 y.o. Female  MRN: [id number]  Subjective:    Chief Complaint  Patient presents with   Annual Exam    Daisy [name] is a 43 y.o. female who presents today for a complete physical exam. She reports consuming a general diet. The patient does not participate in regular exercise at present. She generally feels fairly well. She reports sleeping fairly well. She does not have additional problems to discuss today.    Most recent fall risk assessment:    10/23/2023    2:52 PM  Fall Risk   Falls in the past year? 0  Number falls in past yr: 0  Injury with Fall? 0  Risk for fall due to : No Fall Risks  Follow up Falls evaluation completed     Most recent depression screenings:    10/23/2023    2:52 PM 04/21/2023    9:34 AM  PHQ 2/9 Scores  PHQ - 2 Score 4 2  PHQ- 9 Score 10 7    Vision:Within last year and Dental: No current dental problems and Receives regular dental care    Patient Care Team: Willo Mini, NP as PCP - General (Nurse Practitioner)   Outpatient Medications Prior to Visit  Medication Sig   amLODipine  (NORVASC ) 5 MG tablet Take 1 tablet (5 mg total) by mouth daily.   No facility-administered medications prior to visit.    Review of Systems  Constitutional:  Negative for chills, fever, malaise/fatigue and weight loss.  HENT:  Positive for congestion. Negative for ear pain, hearing loss, sinus pain and sore throat.   Eyes:  Negative for blurred vision, photophobia and pain.  Respiratory:  Negative for cough, shortness of breath and wheezing.   Cardiovascular:  Negative for chest pain, palpitations and leg swelling.  Gastrointestinal:  Positive for constipation, diarrhea and nausea. Negative for abdominal pain, heartburn and vomiting.  Genitourinary:  Negative for dysuria, frequency and urgency.  Musculoskeletal:  Negative for falls and neck pain.  Skin:  Negative for itching and rash.   Neurological:  Positive for tingling (bilateral hands). Negative for dizziness, weakness and headaches.  Endo/Heme/Allergies:  Negative for polydipsia. Does not bruise/bleed easily.  Psychiatric/Behavioral:  Negative for depression, substance abuse and suicidal ideas. The patient is not nervous/anxious.      Objective:     BP 130/80 (BP Location: Right Arm, Cuff Size: Normal)   Pulse 84   Resp 20   Ht 5' 6 (1.676 m)   Wt 193 lb (87.5 kg)   LMP 05/17/2022   SpO2 97%   BMI 31.15 kg/m    Physical Exam Vitals reviewed.  Constitutional:      General: She is not in acute distress.    Appearance: Normal appearance. She is obese. She is not ill-appearing.  HENT:     Head: Normocephalic and atraumatic.     Right Ear: Tympanic membrane, ear canal and external ear normal. There is no impacted cerumen.     Left Ear: Tympanic membrane, ear canal and external ear normal. There is impacted cerumen.     Nose: Nose normal. No congestion or rhinorrhea.     Mouth/Throat:     Mouth: Mucous membranes are moist.     Pharynx: No oropharyngeal exudate or posterior oropharyngeal erythema.  Eyes:     General: No scleral icterus.       Right eye: No discharge.  Left eye: No discharge.     Extraocular Movements: Extraocular movements intact.     Conjunctiva/sclera: Conjunctivae normal.     Pupils: Pupils are equal, round, and reactive to light.  Neck:     Thyroid: No thyromegaly.     Vascular: No carotid bruit or JVD.     Trachea: Trachea normal.  Cardiovascular:     Rate and Rhythm: Normal rate and regular rhythm.     Pulses: Normal pulses.     Heart sounds: Normal heart sounds. No murmur heard.    No friction rub. No gallop.  Pulmonary:     Effort: Pulmonary effort is normal. No respiratory distress.     Breath sounds: Normal breath sounds. No wheezing.  Abdominal:     General: Bowel sounds are normal. There is no distension.     Palpations: Abdomen is soft.     Tenderness: There  is no abdominal tenderness. There is no guarding.  Musculoskeletal:        General: Normal range of motion.     Cervical back: Normal range of motion and neck supple.  Lymphadenopathy:     Cervical: No cervical adenopathy.  Skin:    General: Skin is warm and dry.  Neurological:     Mental Status: She is alert and oriented to person, place, and time.     Cranial Nerves: No cranial nerve deficit.  Psychiatric:        Mood and Affect: Mood normal.        Behavior: Behavior normal.        Thought Content: Thought content normal.        Judgment: Judgment normal.   No results found for any visits on 10/23/23.     Assessment & Plan:    Routine Health Maintenance and Physical Exam  Immunization History  Administered Date(s) Administered   DTaP 12/29/1979, 02/17/1980, 04/19/1980, 09/07/1984   Hepatitis A, Ped/Adol-2 Dose 01/09/2010, 11/05/2010   Hepatitis B, PED/ADOLESCENT 01/09/2010, 04/01/2010, 11/05/2010   IPV 12/29/1979, 02/17/1980, 04/19/1980, 09/07/1984   Influenza Inj Mdck Quad Pf 10/31/2015   Influenza, Seasonal, Injecte, Preservative Fre 10/22/2022, 10/23/2023   Influenza,inj,Quad PF,6+ Mos 12/02/2013   Influenza-Unspecified 01/05/2018   MMR 09/07/1984   PFIZER(Purple Top)SARS-COV-2 Vaccination 04/30/2019, 02/07/2020, 09/05/2020, 05/21/2022   Td 01/28/2005, 11/05/2010   Tdap 11/05/2010, 10/22/2022    Health Maintenance  Topic Date Due   HPV VACCINES (1 - 3-dose SCDM series) Never done   Mammogram  Never done   COVID-19 Vaccine (5 - 2025-26 season) 11/08/2023 (Originally 09/28/2023)   Cervical Cancer Screening (HPV/Pap Cotest)  07/06/2025   DTaP/Tdap/Td (9 - Td or Tdap) 10/21/2032   Influenza Vaccine  Completed   Hepatitis B Vaccines 19-59 Average Risk  Completed   Pneumococcal Vaccine  Aged Out   Meningococcal B Vaccine  Aged Out   Hepatitis C Screening  Discontinued   HIV Screening  Discontinued   Discussed health benefits of physical activity, and encouraged her  to engage in regular exercise appropriate for her age and condition.  1. Annual physical exam (Primary) Checking labs as below. UTD on preventative care. Wellness information provided with AVS. - Lipid panel - CBC with Differential/Platelet - CMP14+EGFR  2. Prediabetes Checking A1c.  - Hemoglobin A1c  3. Elevated LDL cholesterol level Checking lipids.  - Lipid panel  4. Primary hypertension BP at goal. Checking labs below. Continue Amlodipine  5mg  daily.  - Lipid panel - CBC with Differential/Platelet - CMP14+EGFR  5. Need for influenza vaccination Flu  vaccine given in office today.  - Flu vaccine trivalent PF, 6mos and older(Flulaval,Afluria,Fluarix,Fluzone)  6. Breast cancer screening by mammogram No prior mammogram. Ordering today.  - MM DIGITAL SCREENING BILATERAL; Future  7. Recurrent major depressive disorder, in partial remission Continues to undergo depression, mostly situational. No current medications or counseling. Not interested in intervention at this time.   8. Impacted cerumen of left ear Indication: Cerumen impaction of the ear(s) Medical necessity statement: On physical examination, cerumen impairs clinically significant portions of the external auditory canal, and tympanic membrane. Noted obstructive, copious cerumen that cannot be removed without magnification and instrumentations. Consent: Discussed benefits and risks of procedure and verbal consent obtained Procedure: Patient was prepped for the procedure. Utilized an otoscope to assess and take note of the ear canal, the tympanic membrane, and the presence, amount, and placement of the cerumen. Gentle water irrigation and soft plastic curette was utilized to remove cerumen.  Post procedure examination: shows cerumen was completely removed. Patient tolerated procedure well. The patient is made aware that they may experience temporary vertigo, temporary hearing loss, and temporary discomfort. If these symptom  last for more than 24 hours to call the clinic or proceed to the ED.  Return in about 6 months (around 04/21/2024) for HTN follow up.     Larico Dimock, NP

## 2023-10-23 NOTE — Patient Instructions (Signed)
 Preventive Care 58-44 Years Old, Female  Preventive care refers to lifestyle choices and visits with your health care provider that can promote health and wellness. Preventive care visits are also called wellness exams.  What can I expect for my preventive care visit?  Counseling  Your health care provider may ask you questions about your:  Medical history, including:  Past medical problems.  Family medical history.  Pregnancy history.  Current health, including:  Menstrual cycle.  Method of birth control.  Emotional well-being.  Home life and relationship well-being.  Sexual activity and sexual health.  Lifestyle, including:  Alcohol, nicotine or tobacco, and drug use.  Access to firearms.  Diet, exercise, and sleep habits.  Work and work Astronomer.  Sunscreen use.  Safety issues such as seatbelt and bike helmet use.  Physical exam  Your health care provider will check your:  Height and weight. These may be used to calculate your BMI (body mass index). BMI is a measurement that tells if you are at a healthy weight.  Waist circumference. This measures the distance around your waistline. This measurement also tells if you are at a healthy weight and may help predict your risk of certain diseases, such as type 2 diabetes and high blood pressure.  Heart rate and blood pressure.  Body temperature.  Skin for abnormal spots.  What immunizations do I need?    Vaccines are usually given at various ages, according to a schedule. Your health care provider will recommend vaccines for you based on your age, medical history, and lifestyle or other factors, such as travel or where you work.  What tests do I need?  Screening  Your health care provider may recommend screening tests for certain conditions. This may include:  Lipid and cholesterol levels.  Diabetes screening. This is done by checking your blood sugar (glucose) after you have not eaten for a while (fasting).  Pelvic exam and Pap test.  Hepatitis B test.  Hepatitis C  test.  HIV (human immunodeficiency virus) test.  STI (sexually transmitted infection) testing, if you are at risk.  Lung cancer screening.  Colorectal cancer screening.  Mammogram. Talk with your health care provider about when you should start having regular mammograms. This may depend on whether you have a family history of breast cancer.  BRCA-related cancer screening. This may be done if you have a family history of breast, ovarian, tubal, or peritoneal cancers.  Bone density scan. This is done to screen for osteoporosis.  Talk with your health care provider about your test results, treatment options, and if necessary, the need for more tests.  Follow these instructions at home:  Eating and drinking    Eat a diet that includes fresh fruits and vegetables, whole grains, lean protein, and low-fat dairy products.  Take vitamin and mineral supplements as recommended by your health care provider.  Do not drink alcohol if:  Your health care provider tells you not to drink.  You are pregnant, may be pregnant, or are planning to become pregnant.  If you drink alcohol:  Limit how much you have to 0-1 drink a day.  Know how much alcohol is in your drink. In the [place] one drink equals one 12 oz bottle of beer (355 mL), one 5 oz glass of wine (148 mL), or one 1 oz glass of hard liquor (44 mL).  Lifestyle  Brush your teeth every morning and night with fluoride toothpaste. Floss one time each day.  Exercise for at least  30 minutes 5 or more days each week.  Do not use any products that contain nicotine or tobacco. These products include cigarettes, chewing tobacco, and vaping devices, such as e-cigarettes. If you need help quitting, ask your health care provider.  Do not use drugs.  If you are sexually active, practice safe sex. Use a condom or other form of protection to prevent STIs.  If you do not wish to become pregnant, use a form of birth control. If you plan to become pregnant, see your health care provider for a  prepregnancy visit.  Take aspirin only as told by your health care provider. Make sure that you understand how much to take and what form to take. Work with your health care provider to find out whether it is safe and beneficial for you to take aspirin daily.  Find healthy ways to manage stress, such as:  Meditation, yoga, or listening to music.  Journaling.  Talking to a trusted person.  Spending time with friends and family.  Minimize exposure to UV radiation to reduce your risk of skin cancer.  Safety  Always wear your seat belt while driving or riding in a vehicle.  Do not drive:  If you have been drinking alcohol. Do not ride with someone who has been drinking.  When you are tired or distracted.  While texting.  If you have been using any mind-altering substances or drugs.  Wear a helmet and other protective equipment during sports activities.  If you have firearms in your house, make sure you follow all gun safety procedures.  Seek help if you have been physically or sexually abused.  What's next?  Visit your health care provider once a year for an annual wellness visit.  Ask your health care provider how often you should have your eyes and teeth checked.  Stay up to date on all vaccines.  This information is not intended to replace advice given to you by your health care provider. Make sure you discuss any questions you have with your health care provider.  Document Revised: 07/11/2020 Document Reviewed: 07/11/2020  Elsevier Patient Education  2024 [employer].

## 2023-10-24 ENCOUNTER — Ambulatory Visit: Payer: Self-pay | Admitting: Medical-Surgical

## 2023-10-24 LAB — LIPID PANEL
Chol/HDL Ratio: 3.7 ratio (ref 0.0–4.4)
Cholesterol, Total: 225 mg/dL — ABNORMAL HIGH (ref 100–199)
HDL: 61 mg/dL (ref >39)
LDL Chol Calc (NIH): 148 mg/dL — ABNORMAL HIGH (ref 0–99)
Triglycerides: 89 mg/dL (ref 0–149)
VLDL Cholesterol Cal: 16 mg/dL (ref 5–40)

## 2023-10-24 LAB — CBC WITH DIFFERENTIAL/PLATELET
Basophils Absolute: 0 x10E3/uL (ref 0.0–0.2)
Basos: 1 %
EOS (ABSOLUTE): 0.1 x10E3/uL (ref 0.0–0.4)
Eos: 2 %
Hematocrit: 43.2 % (ref 34.0–46.6)
Hemoglobin: 14.1 g/dL (ref 11.1–15.9)
Immature Grans (Abs): 0 x10E3/uL (ref 0.0–0.1)
Immature Granulocytes: 0 %
Lymphocytes Absolute: 2.7 x10E3/uL (ref 0.7–3.1)
Lymphs: 37 %
MCH: 27.4 pg (ref 26.6–33.0)
MCHC: 32.6 g/dL (ref 31.5–35.7)
MCV: 84 fL (ref 79–97)
Monocytes Absolute: 0.5 x10E3/uL (ref 0.1–0.9)
Monocytes: 7 %
Neutrophils Absolute: 3.9 x10E3/uL (ref 1.4–7.0)
Neutrophils: 53 %
Platelets: 234 x10E3/uL (ref 150–450)
RBC: 5.15 x10E6/uL (ref 3.77–5.28)
RDW: 12.9 % (ref 11.7–15.4)
WBC: 7.2 x10E3/uL (ref 3.4–10.8)

## 2023-10-24 LAB — CMP14+EGFR
ALT: 23 IU/L (ref 0–32)
AST: 21 IU/L (ref 0–40)
Albumin: 4.8 g/dL (ref 3.9–4.9)
Alkaline Phosphatase: 90 IU/L (ref 41–116)
BUN/Creatinine Ratio: 24 — ABNORMAL HIGH (ref 9–23)
BUN: 17 mg/dL (ref 6–24)
Bilirubin Total: 0.3 mg/dL (ref 0.0–1.2)
CO2: 21 mmol/L (ref 20–29)
Calcium: 9.5 mg/dL (ref 8.7–10.2)
Chloride: 103 mmol/L (ref 96–106)
Creatinine, Ser: 0.71 mg/dL (ref 0.57–1.00)
Globulin, Total: 2.9 g/dL (ref 1.5–4.5)
Glucose: 78 mg/dL (ref 70–99)
Potassium: 4.1 mmol/L (ref 3.5–5.2)
Sodium: 140 mmol/L (ref 134–144)
Total Protein: 7.7 g/dL (ref 6.0–8.5)
eGFR: 107 mL/min/1.73 (ref >59)

## 2023-10-24 LAB — HEMOGLOBIN A1C
Est. average glucose Bld gHb Est-mCnc: 111 mg/dL
Hgb A1c MFr Bld: 5.5 % (ref 4.8–5.6)

## 2023-10-27 ENCOUNTER — Encounter: Payer: Self-pay | Admitting: Medical-Surgical

## 2023-11-13 ENCOUNTER — Ambulatory Visit

## 2023-11-13 DIAGNOSIS — Z1231 Encounter for screening mammogram for malignant neoplasm of breast: Secondary | ICD-10-CM | POA: Diagnosis not present

## 2023-11-19 ENCOUNTER — Other Ambulatory Visit: Payer: Self-pay | Admitting: Medical-Surgical

## 2023-11-19 DIAGNOSIS — R928 Other abnormal and inconclusive findings on diagnostic imaging of breast: Secondary | ICD-10-CM

## 2023-11-30 ENCOUNTER — Encounter

## 2023-11-30 ENCOUNTER — Other Ambulatory Visit

## 2024-01-07 ENCOUNTER — Encounter: Payer: Self-pay | Admitting: Medical-Surgical

## 2024-01-15 ENCOUNTER — Inpatient Hospital Stay: Admission: RE | Admit: 2024-01-15 | Discharge: 2024-01-15 | Attending: Medical-Surgical

## 2024-01-15 ENCOUNTER — Ambulatory Visit: Payer: Self-pay | Admitting: Medical-Surgical

## 2024-01-15 ENCOUNTER — Inpatient Hospital Stay: Admission: RE | Admit: 2024-01-15

## 2024-01-15 DIAGNOSIS — R928 Other abnormal and inconclusive findings on diagnostic imaging of breast: Secondary | ICD-10-CM

## 2024-04-22 ENCOUNTER — Ambulatory Visit: Admitting: Medical-Surgical
